# Patient Record
Sex: Female | Born: 1985
Health system: Southern US, Community
[De-identification: ages and names within clinical notes are randomized; demographics above are authoritative.]

## PROBLEM LIST (undated history)

## (undated) DIAGNOSIS — M549 Dorsalgia, unspecified: Secondary | ICD-10-CM

## (undated) DIAGNOSIS — N83209 Unspecified ovarian cyst, unspecified side: Secondary | ICD-10-CM

---

## 2007-09-14 ENCOUNTER — Inpatient Hospital Stay (HOSPITAL_COMMUNITY): Admission: AD | Admit: 2007-09-14 | Discharge: 2007-09-14 | Payer: Self-pay | Admitting: Obstetrics & Gynecology

## 2007-10-06 ENCOUNTER — Ambulatory Visit: Payer: Self-pay | Admitting: *Deleted

## 2007-11-25 ENCOUNTER — Ambulatory Visit: Payer: Self-pay | Admitting: Gynecology

## 2009-02-01 ENCOUNTER — Ambulatory Visit: Payer: Self-pay | Admitting: Obstetrics & Gynecology

## 2009-05-03 ENCOUNTER — Encounter (INDEPENDENT_AMBULATORY_CARE_PROVIDER_SITE_OTHER): Payer: Self-pay | Admitting: Obstetrics & Gynecology

## 2009-05-03 ENCOUNTER — Ambulatory Visit: Payer: Self-pay | Admitting: Obstetrics & Gynecology

## 2009-06-08 ENCOUNTER — Ambulatory Visit: Payer: Self-pay | Admitting: Obstetrics and Gynecology

## 2010-05-08 ENCOUNTER — Emergency Department (HOSPITAL_BASED_OUTPATIENT_CLINIC_OR_DEPARTMENT_OTHER): Admission: EM | Admit: 2010-05-08 | Discharge: 2010-05-08 | Payer: Self-pay | Admitting: Emergency Medicine

## 2010-08-20 ENCOUNTER — Ambulatory Visit: Payer: Self-pay | Admitting: Diagnostic Radiology

## 2010-08-20 ENCOUNTER — Emergency Department (HOSPITAL_BASED_OUTPATIENT_CLINIC_OR_DEPARTMENT_OTHER): Admission: EM | Admit: 2010-08-20 | Discharge: 2010-08-20 | Payer: Self-pay | Admitting: Emergency Medicine

## 2010-10-24 ENCOUNTER — Emergency Department (HOSPITAL_BASED_OUTPATIENT_CLINIC_OR_DEPARTMENT_OTHER)
Admission: EM | Admit: 2010-10-24 | Discharge: 2010-10-24 | Payer: Self-pay | Source: Home / Self Care | Admitting: Emergency Medicine

## 2010-10-31 ENCOUNTER — Emergency Department (HOSPITAL_BASED_OUTPATIENT_CLINIC_OR_DEPARTMENT_OTHER)
Admission: EM | Admit: 2010-10-31 | Discharge: 2010-10-31 | Payer: Self-pay | Source: Home / Self Care | Admitting: Emergency Medicine

## 2010-11-02 ENCOUNTER — Emergency Department (HOSPITAL_BASED_OUTPATIENT_CLINIC_OR_DEPARTMENT_OTHER)
Admission: EM | Admit: 2010-11-02 | Discharge: 2010-11-02 | Disposition: A | Discharge status: Other Acute Inpatient Hospital | Payer: Self-pay | Source: Home / Self Care | Admitting: Emergency Medicine

## 2010-11-02 ENCOUNTER — Inpatient Hospital Stay (HOSPITAL_COMMUNITY)
Admission: AD | Admit: 2010-11-02 | Discharge: 2010-11-02 | Payer: Self-pay | Attending: Obstetrics & Gynecology | Admitting: Obstetrics & Gynecology

## 2010-12-15 ENCOUNTER — Emergency Department (HOSPITAL_BASED_OUTPATIENT_CLINIC_OR_DEPARTMENT_OTHER)
Admission: EM | Admit: 2010-12-15 | Discharge: 2010-12-15 | Payer: Self-pay | Source: Home / Self Care | Admitting: Emergency Medicine

## 2010-12-15 LAB — URINALYSIS, ROUTINE W REFLEX MICROSCOPIC
Hgb urine dipstick: NEGATIVE
Ketones, ur: NEGATIVE mg/dL
Nitrite: NEGATIVE
Protein, ur: NEGATIVE mg/dL
Specific Gravity, Urine: 1.037 — ABNORMAL HIGH (ref 1.005–1.030)
Urine Glucose, Fasting: NEGATIVE mg/dL
Urobilinogen, UA: 0.2 mg/dL (ref 0.0–1.0)
pH: 5.5 (ref 5.0–8.0)

## 2010-12-15 LAB — WET PREP, GENITAL
Trich, Wet Prep: NONE SEEN
Yeast Wet Prep HPF POC: NONE SEEN

## 2010-12-15 LAB — PREGNANCY, URINE: Preg Test, Ur: NEGATIVE

## 2010-12-16 LAB — GC/CHLAMYDIA PROBE AMP, GENITAL
Chlamydia, DNA Probe: NEGATIVE
GC Probe Amp, Genital: NEGATIVE

## 2011-01-27 LAB — URINALYSIS, ROUTINE W REFLEX MICROSCOPIC
Bilirubin Urine: NEGATIVE
Bilirubin Urine: NEGATIVE
Glucose, UA: NEGATIVE mg/dL
Glucose, UA: NEGATIVE mg/dL
Hgb urine dipstick: NEGATIVE
Hgb urine dipstick: NEGATIVE
Ketones, ur: NEGATIVE mg/dL
Ketones, ur: NEGATIVE mg/dL
Nitrite: NEGATIVE
Nitrite: NEGATIVE
Protein, ur: NEGATIVE mg/dL
Protein, ur: NEGATIVE mg/dL
Specific Gravity, Urine: 1.013 (ref 1.005–1.030)
Specific Gravity, Urine: 1.026 (ref 1.005–1.030)
Urobilinogen, UA: 0.2 mg/dL (ref 0.0–1.0)
Urobilinogen, UA: 1 mg/dL (ref 0.0–1.0)
pH: 6.5 (ref 5.0–8.0)
pH: 7 (ref 5.0–8.0)

## 2011-01-27 LAB — CBC
HCT: 38.1 % (ref 36.0–46.0)
Hemoglobin: 13 g/dL (ref 12.0–15.0)
MCH: 29.3 pg (ref 26.0–34.0)
MCHC: 34.1 g/dL (ref 30.0–36.0)
MCV: 86 fL (ref 78.0–100.0)
Platelets: 418 10*3/uL — ABNORMAL HIGH (ref 150–400)
RBC: 4.43 MIL/uL (ref 3.87–5.11)
RDW: 11.8 % (ref 11.5–15.5)
WBC: 7.3 10*3/uL (ref 4.0–10.5)

## 2011-01-27 LAB — URINE MICROSCOPIC-ADD ON

## 2011-01-27 LAB — COMPREHENSIVE METABOLIC PANEL
ALT: 11 U/L (ref 0–35)
AST: 23 U/L (ref 0–37)
Albumin: 4.7 g/dL (ref 3.5–5.2)
Alkaline Phosphatase: 100 U/L (ref 39–117)
BUN: 14 mg/dL (ref 6–23)
CO2: 25 mEq/L (ref 19–32)
Calcium: 9.7 mg/dL (ref 8.4–10.5)
Chloride: 103 mEq/L (ref 96–112)
Creatinine, Ser: 0.9 mg/dL (ref 0.4–1.2)
GFR calc Af Amer: >60 mL/min (ref >60)
GFR calc non Af Amer: >60 mL/min (ref >60)
Glucose, Bld: 82 mg/dL (ref 70–99)
Potassium: 3.8 mEq/L (ref 3.5–5.1)
Sodium: 143 mEq/L (ref 135–145)
Total Bilirubin: 1.2 mg/dL (ref 0.3–1.2)
Total Protein: 8.9 g/dL — ABNORMAL HIGH (ref 6.0–8.3)

## 2011-01-27 LAB — DIFFERENTIAL
Basophils Absolute: 0 10*3/uL (ref 0.0–0.1)
Basophils Relative: 0 % (ref 0–1)
Eosinophils Absolute: 0 10*3/uL (ref 0.0–0.7)
Eosinophils Relative: 0 % (ref 0–5)
Lymphocytes Relative: 33 % (ref 12–46)
Lymphs Abs: 2.4 10*3/uL (ref 0.7–4.0)
Monocytes Absolute: 0.7 10*3/uL (ref 0.1–1.0)
Monocytes Relative: 9 % (ref 3–12)
Neutro Abs: 4.2 10*3/uL (ref 1.7–7.7)
Neutrophils Relative %: 58 % (ref 43–77)

## 2011-01-27 LAB — WET PREP, GENITAL
Clue Cells Wet Prep HPF POC: NONE SEEN
Trich, Wet Prep: NONE SEEN
Trich, Wet Prep: NONE SEEN
Yeast Wet Prep HPF POC: NONE SEEN

## 2011-01-27 LAB — GC/CHLAMYDIA PROBE AMP, GENITAL
Chlamydia, DNA Probe: POSITIVE — AB
Chlamydia, DNA Probe: POSITIVE — AB
GC Probe Amp, Genital: NEGATIVE
GC Probe Amp, Genital: NEGATIVE

## 2011-01-27 LAB — PREGNANCY, URINE
Preg Test, Ur: NEGATIVE
Preg Test, Ur: NEGATIVE

## 2011-01-30 LAB — BASIC METABOLIC PANEL
BUN: 10 mg/dL (ref 6–23)
CO2: 26 mEq/L (ref 19–32)
Calcium: 9.6 mg/dL (ref 8.4–10.5)
Chloride: 105 mEq/L (ref 96–112)
Creatinine, Ser: 0.7 mg/dL (ref 0.4–1.2)
GFR calc Af Amer: >60 mL/min (ref >60)
GFR calc non Af Amer: >60 mL/min (ref >60)
Glucose, Bld: 83 mg/dL (ref 70–99)
Potassium: 4.2 mEq/L (ref 3.5–5.1)
Sodium: 140 mEq/L (ref 135–145)

## 2011-01-30 LAB — D-DIMER, QUANTITATIVE: D-Dimer, Quant: <0.22 ug/mL-FEU (ref 0.00–0.48)

## 2011-02-02 LAB — GC/CHLAMYDIA PROBE AMP, GENITAL
Chlamydia, DNA Probe: POSITIVE — AB
GC Probe Amp, Genital: NEGATIVE

## 2011-02-02 LAB — URINALYSIS, ROUTINE W REFLEX MICROSCOPIC
Glucose, UA: NEGATIVE mg/dL
Hgb urine dipstick: NEGATIVE
Ketones, ur: NEGATIVE mg/dL
Protein, ur: NEGATIVE mg/dL
Urobilinogen, UA: 0.2 mg/dL (ref 0.0–1.0)

## 2011-02-02 LAB — WET PREP, GENITAL
Trich, Wet Prep: NONE SEEN
Yeast Wet Prep HPF POC: NONE SEEN

## 2011-02-24 LAB — POCT PREGNANCY, URINE: Preg Test, Ur: NEGATIVE

## 2011-04-01 NOTE — Group Therapy Note (Signed)
NAME:  Anita Gomez, Anita Gomez NO.:  [id number]   MEDICAL RECORD NO.:  [id number]          PATIENT TYPE:  WOC   LOCATION:  [facility]                   FACILITY:  [facility]   PHYSICIAN:  Scheryl Darter, MD       DATE OF BIRTH:  10-18-1986   DATE OF SERVICE:                                  CLINIC NOTE   Nocturnal decaying gynecology clinic.  The patient has [employer]  [phone].  The patient's neck is a 25 year old black female gravida 0,  history of irregular periods with prior diagnosis of polycystic ovary  syndrome.  Last period was light about January 20, 2009.  It had been  several months since the previous menses.  She has stopped her oral  contraceptive pills that were prescribed at her last visit.  Today she  comes in saying she has had about 3 months of vaginal discharge.  She  was seen in the emergency room in January at [facility] and  they diagnosed her with urinary tract infection.  She says that no  pelvic exam was done.  She denies any pain or abnormal bleeding, itching  or burning.  There is slight odor.   PAST MEDICAL HISTORY:  Eczema.   PAST SURGICAL HISTORY:  None.   SOCIAL HISTORY:  The patient is single, and she denies alcohol, tobacco  or drug use.   ALLERGIES:  To SHELLFISH. No latex allergy.   PHYSICAL EXAMINATION:  GENERAL:  She appears in no acute distress.  VITAL SIGNS:  Weight 162 pounds, BP 133/86.  ABDOMEN:  Nondistended, nontender.  EXTERNAL GENITALIA:  Vagina and cervix show slight discharge and odor.  Cervix is normal.  Uterus normal size, nontender, no mass. Wet mount  showed many clue cells, few white blood cells, no yeast, no Trichomonas.   IMPRESSION:  1. Bacterial vaginosis.  2. Irregular periods with polycystic ovary syndrome.   PLAN:  I gave a prescription for MetroGel vaginal h.s. for 5 Thursdays.  She wants to start back on her oral contraceptives.  I gave a her a new  prescription for Mircette.  She says she has  had no sex since her last  period.  I recommended that she use condoms to prevent sexually  transmitted diseases.  She will return in about 3 months for a yearly  exam.      Scheryl Darter, MD    JA/MEDQ  D:  02/01/2009  T:  02/01/2009  Job:  [id number]

## 2011-04-01 NOTE — Group Therapy Note (Signed)
Anita Gomez, Anita Gomez NO.:  [id number]   MEDICAL RECORD NO.:  [id number]          PATIENT TYPE:  WOC   LOCATION:  [facility]                   FACILITY:  [facility]   PHYSICIAN:  Karlton Lemon, MD      DATE OF BIRTH:  06/18/1986   DATE OF SERVICE:                                  CLINIC NOTE   CHIEF COMPLAINT:  No periods for past 7-8 months.   HISTORY OF PRESENT ILLNESS:  This is a 25 year old gravida 0, presenting  with 7-8 months of secondary amenorrhea.  The patient was seen on  09/14/07 in the [facility] for this complaint.  She was evaluated there but  states she did not have a urine pregnancy test at that time.  The  patient states that her menarche was at age 5.  Since that time she has  had periods every 2-3 months lasting 10-14 days requiring 2-3 pads a  day.  She states that she has never had regular periods.  She denies any  history of sexual intercourse at this time.  She denies any pelvic pain,  vaginal disorder or other complaints.  She is otherwise healthy.   PAST MEDICAL HISTORY:  Eczema.   PAST SURGICAL HISTORY:  None.   MEDICATIONS:  Steroid cream unspecified type twice daily for eczema.   ALLERGIES:  No known drug allergies. She is allergic to shell fish.   PHYSICAL EXAMINATION:  GENERAL:  This is a well-appearing young female  in no distress.  VITALS:  Temperature 97.8, pulse 59, blood pressure 123/83.  HEENT:  Head is normocephalic, atraumatic.  Sclerae are nonicteric.  Oropharynx is clear without erythema or exudate.  CARDIOVASCULAR:  Heart is regular rate and rhythm.  No murmurs, rubs or  gallops noted.  RESPIRATORY:  Lungs are clear to auscultation bilaterally.  ABDOMEN:  Soft, nontender to palpation.  Positive bowel sounds are noted  in all four quadrants.  There are no masses palpable.  GU:  Normal female external genitalia.  Pubic hairs are shaved in  general region.  Vaginal mucosa is pink and moist.  There is no  discharge noted.   Cervix appears nulliparous.  On bimanual exam, uterus  is midline without cervical motion tenderness.  Adnexa difficult to  palpate due to the patient tensing her abdominal muscles on bimanual  exam.  EXTREMITIES:  No edema or tenderness.   ASSESSMENT AND PLAN:  This is a 25 year old gravida 0, with secondary  amenorrhea and history of irregular menstrual periods.  We will evaluate  for possible hypothalamic pituitary ovarian disorder possible polycystic  ovarian syndrome or pregnancy.  1. We will evaluate with prolactin dihydroepiandrosterone serum, beta      hCG, thyroid simulating hormone, and follicle stimulate hormone      levels.  2. The patient is to follow in 2 weeks to discuss these results.  At      that time we will possibly initiate management based on results of      these tests.           ______________________________  Karlton Lemon, MD     NS/MEDQ  D:  10/06/2007  T:  10/07/2007  Job:  [id number]

## 2011-04-01 NOTE — Group Therapy Note (Signed)
NAMETULIP, MEHARG NO.:  [id number]   MEDICAL RECORD NO.:  [id number]          PATIENT TYPE:  WOC   LOCATION:  [facility]                   FACILITY:  [facility]   PHYSICIAN:  Wynelle Bourgeois, CNM    DATE OF BIRTH:  09/28/1986   DATE OF SERVICE:                                  CLINIC NOTE   DICTATED BY:  Marissa Nestle, Physician's Assistant, scribe for Wynelle Bourgeois, C.N.M.   CHIEF COMPLAINT:  Vaginal discharge and slight odor.   HISTORY OF PRESENT ILLNESS:  Ms. Kithcart is a 25 year old African  American female, gravida 0, who returns for a follow-up visit for  bacterial vaginosis that she had back on Apr 03, 2009, for which she was  prescribed MetroGel for five weeks.  She reports she finished the  prescription, but has most recently, over the past couple of weeks,  noticed an increased white milky discharge from her vagina, along with a  slight odor.  She denies any pelvic pain, any vaginal pain or bleeding.  She also reports that she continues to have irregular menstrual periods,  as she does not menstruate every month.  She in fact reports that she  has never had normal menstrual cycles since beginning her period at the  age of 57.  She has reportedly not had a period in the past six months.  In addition, the patient reports that she has not had an annual Pap  smear.  She does not recall when the last one she had done.  She wishes  to have her Pap smear completed today as well.   PAST MEDICAL HISTORY:  Eczema.   PAST SURGICAL HISTORY:  None.   SOCIAL HISTORY:  The patient is single and denies any alcohol, tobacco  or drug use.   ALLERGIES:  SHELL FISH.  No latex allergy.  No known drug allergies  reported.   REVIEW OF SYSTEMS:  GENERAL:  Denies any weight change, fatigue,  weakness, fevers or chills.  SKIN:  No rashes, lumps or moles.  LUNGS:  No shortness of breath noted.  CARDIAC:  No chest pain.  GI:  No nausea,  vomiting or diarrhea.  No  constipation or abdominal pain.  URINARY:  Denies any frequency or urgency or dysuria.  GENITAL:  Refer to the HPI.   PHYSICAL EXAMINATION:  GENERAL:  A 25 year old African American well-  appearing female, of normal stature, normal appropriate development with  good hygiene, in no apparent distress.  VITAL SIGNS:  Temperature 97.5 degrees, pulse 60, blood pressure 122/78,  weight 162.6 pounds, height 64-1/5 inches.  LUNGS:  Clear to auscultation bilaterally.  No wheezes, crackles or  rhonchi.  CARDIAC:  Regular rate and rhythm.  Good S1 and S2.  No lifts, heaves or  thrills.  ABDOMEN:  Soft, nondistended.  Positive bowel sounds.  Nontender to deep  and light palpation.  GENITOURINARY/BIMANUAL EXAM:  Vagina and cervix show no abnormal  discharge or odor.  Cervix is normal-appearing and not inflamed, with no  sign of bleeding.  No cervical motion tenderness appreciated on bimanual  exam.  No adnexal masses noted.  A  Pap smear completed along with GC and  Chlamydia.   ASSESSMENT:  1. Resolved bacterial vaginosis.  2. Oligomenorrhea.   PLAN:  The patient was given a prescription for Ortho Evra patch and is  instructed to follow up in four to six weeks, for a nurse visit to  monitor her blood pressure.  In addition, the patient will follow up for  her Pap smear results at that time.           ______________________________  Wynelle Bourgeois, CNM     MW/MEDQ  D:  05/03/2009  T:  05/03/2009  Job:  [id number]

## 2011-08-10 ENCOUNTER — Emergency Department (HOSPITAL_BASED_OUTPATIENT_CLINIC_OR_DEPARTMENT_OTHER)
Admission: EM | Admit: 2011-08-10 | Discharge: 2011-08-10 | Disposition: A | Discharge status: Home / Self Care | Payer: BC Managed Care – PPO | Attending: Emergency Medicine | Admitting: Emergency Medicine

## 2011-08-10 ENCOUNTER — Encounter: Payer: Self-pay | Admitting: *Deleted

## 2011-08-10 DIAGNOSIS — R300 Dysuria: Secondary | ICD-10-CM | POA: Insufficient documentation

## 2011-08-10 DIAGNOSIS — R109 Unspecified abdominal pain: Secondary | ICD-10-CM | POA: Insufficient documentation

## 2011-08-10 DIAGNOSIS — N390 Urinary tract infection, site not specified: Secondary | ICD-10-CM

## 2011-08-10 LAB — URINE MICROSCOPIC-ADD ON

## 2011-08-10 LAB — URINALYSIS, ROUTINE W REFLEX MICROSCOPIC
Bilirubin Urine: NEGATIVE
Hgb urine dipstick: NEGATIVE
Ketones, ur: NEGATIVE mg/dL
Nitrite: NEGATIVE
Specific Gravity, Urine: 1.031 — ABNORMAL HIGH (ref 1.005–1.030)
pH: 6 (ref 5.0–8.0)

## 2011-08-10 MED ORDER — SULFAMETHOXAZOLE-TRIMETHOPRIM 800-160 MG PO TABS: 1.0000 | ORAL_TABLET | Freq: Two times a day (BID) | ORAL | Status: AC

## 2011-08-10 NOTE — ED Provider Notes (Signed)
History     CSN: [id number] Arrival date & time: 08/10/2011 12:36 PM  No chief complaint on file.   HPI  (Consider location/radiation/quality/duration/timing/severity/associated sxs/prior treatment)  Patient is a 25 y.o. female presenting with dysuria. The history is provided by the patient. No language interpreter was used.  Dysuria  This is a new problem. The current episode started 2 days ago. The problem occurs intermittently. The problem has not changed since onset.She is sexually active. Associated symptoms include nausea, frequency, hesitancy and urgency. Pertinent negatives include no vomiting, no discharge and no hematuria. She has tried nothing for the symptoms.    No past medical history on file.  No past surgical history on file.  No family history on file.  History  Substance Use Topics  . Smoking status: Not on file  . Smokeless tobacco: Not on file  . Alcohol Use: Not on file    OB History    No data available      Review of Systems  Review of Systems  Gastrointestinal: Positive for nausea. Negative for vomiting.  Genitourinary: Positive for dysuria, hesitancy, urgency and frequency. Negative for hematuria.  All other systems reviewed and are negative.    Allergies  Review of patient's allergies indicates not on file.  Home Medications  No current outpatient prescriptions on file.  Physical Exam    BP 140/79  Pulse 77  Temp(Src) 97.5 F (36.4 C) (Oral)  Resp 16  Ht 5\' 4"  (1.626 m)  Wt 170 lb (77.111 kg)  BMI 29.18 kg/m2  SpO2 100%  Physical Exam  Nursing note and vitals reviewed. Constitutional: She is oriented to person, place, and time. She appears well-developed and well-nourished.  HENT:  Head: Normocephalic.  Cardiovascular: Normal rate and regular rhythm.   Pulmonary/Chest: Effort normal and breath sounds normal.  Abdominal: Soft. Bowel sounds are normal.  Musculoskeletal: Normal range of motion.  Neurological: She is alert and  oriented to person, place, and time.  Skin: Skin is warm and dry.  Psychiatric: She has a normal mood and affect.    ED Course  Procedures (including critical care time)  Results for orders placed during the hospital encounter of 08/10/11  URINALYSIS, ROUTINE W REFLEX MICROSCOPIC      Component Value Range   Color, Urine YELLOW  YELLOW    Appearance CLOUDY (*) CLEAR    Specific Gravity, Urine 1.031 (*) 1.005 - 1.030    pH 6.0  5.0 - 8.0    Glucose, UA NEGATIVE  NEGATIVE (mg/dL)   Hgb urine dipstick NEGATIVE  NEGATIVE    Bilirubin Urine NEGATIVE  NEGATIVE    Ketones, ur NEGATIVE  NEGATIVE (mg/dL)   Protein, ur NEGATIVE  NEGATIVE (mg/dL)   Urobilinogen, UA 1.0  0.0 - 1.0 (mg/dL)   Nitrite NEGATIVE  NEGATIVE    Leukocytes, UA MODERATE (*) NEGATIVE   PREGNANCY, URINE      Component Value Range   Preg Test, Ur NEGATIVE    URINE MICROSCOPIC-ADD ON      Component Value Range   Squamous Epithelial / LPF FEW (*) RARE    WBC, UA 21-50  <3 (WBC/hpf)   Bacteria, UA MANY (*) RARE    Urine-Other MUCOUS PRESENT     No results found.    MDM Will treat pt for simple uti:pt is afebrile without any back or abdominal pain        Teressa Lower, NP 08/10/11 1311

## 2011-08-10 NOTE — ED Notes (Signed)
C/o frequency, urgency and lower abd pressure starting last Thursday, sexually active with 1 partner, condoms, denies pregnancy. LMP 8/21 normal , report urine has an odor, she is drinking water and cranberry juice, denies vag drainage

## 2011-08-10 NOTE — ED Provider Notes (Signed)
Medical screening examination/treatment/procedure(s) were performed by non-physician practitioner and as supervising physician I was immediately available for consultation/collaboration.      Rolan Bucco, MD 08/10/11 [phone]

## 2012-08-13 ENCOUNTER — Encounter (HOSPITAL_BASED_OUTPATIENT_CLINIC_OR_DEPARTMENT_OTHER): Payer: Self-pay | Admitting: *Deleted

## 2012-08-13 ENCOUNTER — Emergency Department (HOSPITAL_BASED_OUTPATIENT_CLINIC_OR_DEPARTMENT_OTHER)
Admission: EM | Admit: 2012-08-13 | Discharge: 2012-08-13 | Disposition: A | Discharge status: Home / Self Care | Payer: Self-pay | Attending: Emergency Medicine | Admitting: Emergency Medicine

## 2012-08-13 DIAGNOSIS — B9689 Other specified bacterial agents as the cause of diseases classified elsewhere: Secondary | ICD-10-CM | POA: Insufficient documentation

## 2012-08-13 DIAGNOSIS — N760 Acute vaginitis: Secondary | ICD-10-CM | POA: Insufficient documentation

## 2012-08-13 DIAGNOSIS — B373 Candidiasis of vulva and vagina: Secondary | ICD-10-CM

## 2012-08-13 DIAGNOSIS — B3731 Acute candidiasis of vulva and vagina: Secondary | ICD-10-CM | POA: Insufficient documentation

## 2012-08-13 DIAGNOSIS — A499 Bacterial infection, unspecified: Secondary | ICD-10-CM | POA: Insufficient documentation

## 2012-08-13 LAB — WET PREP, GENITAL

## 2012-08-13 LAB — URINALYSIS, ROUTINE W REFLEX MICROSCOPIC
Ketones, ur: NEGATIVE mg/dL
Nitrite: NEGATIVE
Protein, ur: NEGATIVE mg/dL
Urobilinogen, UA: 0.2 mg/dL (ref 0.0–1.0)

## 2012-08-13 LAB — PREGNANCY, URINE: Preg Test, Ur: NEGATIVE

## 2012-08-13 MED ORDER — METRONIDAZOLE 500 MG PO TABS: 500.0000 mg | ORAL_TABLET | Freq: Two times a day (BID) | ORAL | Status: DC

## 2012-08-13 MED ORDER — FLUCONAZOLE 50 MG PO TABS
150.0000 mg | ORAL_TABLET | Freq: Once | ORAL | Status: AC
  Administered 2012-08-13: 150 mg via ORAL
  Filled 2012-08-13: qty 1

## 2012-08-13 NOTE — ED Provider Notes (Signed)
Medical screening examination/treatment/procedure(s) were performed by non-physician practitioner and as supervising physician I was immediately available for consultation/collaboration.   Charles B. Bernette Mayers, MD 08/13/12 [phone]

## 2012-08-13 NOTE — ED Notes (Signed)
Reports last eating at noon today.

## 2012-08-13 NOTE — ED Notes (Signed)
Vaginal discharge x 3 weeks. 

## 2012-08-13 NOTE — ED Provider Notes (Signed)
History     CSN: [id number]  Arrival date & time 08/13/12  1900   First MD Initiated Contact with Patient 08/13/12 1944      Chief Complaint  Patient presents with  . Vaginal Discharge    (Consider location/radiation/quality/duration/timing/severity/associated sxs/prior treatment) HPI Comments: Patient presents with vaginal discharge for approximately 3 weeks. The discharge is white, lumpy, and without odor. She has tried treating it with 'BH cream' however it has not helped. Denies dysuria, frequency, nausea, vomiting. Patient has had diarrhea for 1.5 weeks but it is improving. She is sexually active with one female and uses condoms. She is not concerned for any STI's. She has irregular menses with her last period in early August. Onset gradual. Course is constant. Nothing makes symptoms better or worse.  The history is provided by the patient.    History reviewed. No pertinent past medical history.  History reviewed. No pertinent past surgical history.  No family history on file.  History  Substance Use Topics  . Smoking status: Never Smoker   . Smokeless tobacco: Not on file  . Alcohol Use: No    OB History    Grav Para Term Preterm Abortions TAB SAB Ect Mult Living                  Review of Systems  Constitutional: Negative for fever.  HENT: Negative for sore throat and rhinorrhea.   Gastrointestinal: Negative for nausea, vomiting, abdominal pain and diarrhea.  Genitourinary: Positive for vaginal discharge. Negative for dysuria, frequency, vaginal pain and pelvic pain.  Musculoskeletal: Negative for myalgias.  Skin: Negative for rash.    Allergies  Review of patient's allergies indicates no known allergies.  Home Medications  No current outpatient prescriptions on file.  BP 131/77  Pulse 66  Temp 98.1 F (36.7 C) (Oral)  Resp 18  SpO2 100%  LMP 06/17/2012  Physical Exam  Nursing note and vitals reviewed. Constitutional: She appears well-developed and  well-nourished.  HENT:  Head: Normocephalic and atraumatic.  Eyes: Conjunctivae normal are normal.  Neck: Normal range of motion. Neck supple.  Cardiovascular: Normal rate and regular rhythm.   No murmur heard. Pulmonary/Chest: Effort normal and breath sounds normal. No respiratory distress. She has no wheezes.  Abdominal: Soft. Bowel sounds are normal. There is no tenderness.  Genitourinary: Cervix exhibits no motion tenderness and no discharge. Right adnexum displays no mass and no tenderness. Left adnexum displays no mass and no tenderness. No erythema, tenderness or bleeding around the vagina. Vaginal discharge (white, thick) found.  Neurological: She is alert.  Skin: Skin is warm and dry.  Psychiatric: She has a normal mood and affect.    ED Course  Procedures (including critical care time)  Labs Reviewed  URINALYSIS, ROUTINE W REFLEX MICROSCOPIC - Abnormal; Notable for the following:    APPearance CLOUDY (*)     Leukocytes, UA SMALL (*)     All other components within normal limits  URINE MICROSCOPIC-ADD ON - Abnormal; Notable for the following:    Squamous Epithelial / LPF FEW (*)     Bacteria, UA MANY (*)     All other components within normal limits  WET PREP, GENITAL - Abnormal; Notable for the following:    Yeast Wet Prep HPF POC MODERATE (*)     Clue Cells Wet Prep HPF POC MODERATE (*)     WBC, Wet Prep HPF POC FEW (*)     All other components within normal limits  PREGNANCY, URINE  GC/CHLAMYDIA PROBE AMP, GENITAL   No results found.   1. Vaginal candidiasis   2. Bacterial vaginosis     8:17 PM Patient seen and examined. Work-up initiated.   Vital signs reviewed and are as follows: Filed Vitals:   08/13/12 1914  BP: 131/77  Pulse: 66  Temp: 98.1 F (36.7 C)  Resp: 18   Pelvic performed with chaperone.  Patient informed of results. Will treat in emergency department with Diflucan. Will treat BV with prescription of Flagyl.  Patient urged to follow  up with her gynecologist. Renae Gloss to return with worsening symptoms or other concerns. Patient verbalizes understanding and agrees with plan.   MDM  Vaginal discharge: Moderate yeast, moderate clue cells. Will treat for both bacterial vaginosis and vaginal candidiasis. No tenderness on pelvic exam. Do not suspect PID. Patient appears well and nontoxic.       Renne Crigler, [place] 08/13/12 2220

## 2012-08-14 LAB — GC/CHLAMYDIA PROBE AMP, GENITAL
Chlamydia, DNA Probe: NEGATIVE
GC Probe Amp, Genital: NEGATIVE

## 2013-10-21 ENCOUNTER — Emergency Department (HOSPITAL_BASED_OUTPATIENT_CLINIC_OR_DEPARTMENT_OTHER)
Admission: EM | Admit: 2013-10-21 | Discharge: 2013-10-21 | Disposition: A | Discharge status: Home / Self Care | Payer: BC Managed Care – PPO | Attending: Emergency Medicine | Admitting: Emergency Medicine

## 2013-10-21 ENCOUNTER — Encounter (HOSPITAL_BASED_OUTPATIENT_CLINIC_OR_DEPARTMENT_OTHER): Payer: Self-pay | Admitting: Emergency Medicine

## 2013-10-21 DIAGNOSIS — Z792 Long term (current) use of antibiotics: Secondary | ICD-10-CM | POA: Insufficient documentation

## 2013-10-21 DIAGNOSIS — J029 Acute pharyngitis, unspecified: Secondary | ICD-10-CM | POA: Insufficient documentation

## 2013-10-21 LAB — RAPID STREP SCREEN (MED CTR MEBANE ONLY): Streptococcus, Group A Screen (Direct): NEGATIVE

## 2013-10-21 MED ORDER — DEXAMETHASONE 4 MG PO TABS
8.0000 mg | ORAL_TABLET | Freq: Once | ORAL | Status: AC
  Administered 2013-10-21: 8 mg via ORAL
  Filled 2013-10-21: qty 2

## 2013-10-21 NOTE — ED Provider Notes (Signed)
CSN: [id number]     Arrival date & time 10/21/13  1234 History   First MD Initiated Contact with Patient 10/21/13 1239     Chief Complaint  Patient presents with  . Sore Throat   (Consider location/radiation/quality/duration/timing/severity/associated sxs/prior Treatment) Patient is a 27 y.o. female presenting with pharyngitis. The history is provided by the patient. No language interpreter was used.  Sore Throat This is a new problem. The current episode started yesterday. The problem occurs constantly. The problem has been unchanged. Associated symptoms include coughing. Pertinent negatives include no fever. Associated symptoms comments: Post nasal drip. The symptoms are aggravated by swallowing. She has tried nothing for the symptoms.    History reviewed. No pertinent past medical history. History reviewed. No pertinent past surgical history. No family history on file. History  Substance Use Topics  . Smoking status: Never Smoker   . Smokeless tobacco: Not on file  . Alcohol Use: No   OB History   Grav Para Term Preterm Abortions TAB SAB Ect Mult Living                 Review of Systems  Constitutional: Negative for fever.  Respiratory: Positive for cough.     Allergies  Review of patient's allergies indicates no known allergies.  Home Medications   Current Outpatient Rx  Name  Route  Sig  Dispense  Refill  . metroNIDAZOLE (FLAGYL) 500 MG tablet   Oral   Take 1 tablet (500 mg total) by mouth 2 (two) times daily.   14 tablet   0    BP 147/53  Pulse 64  Temp(Src) 98.6 F (37 C) (Oral)  Resp 20  Wt 170 lb (77.111 kg)  SpO2 100% Physical Exam  Nursing note and vitals reviewed. Constitutional: She appears well-developed and well-nourished.  HENT:  Right Ear: External ear normal.  Left Ear: External ear normal.  Nose: Rhinorrhea present.  Mouth/Throat: Posterior oropharyngeal erythema present.  Eyes: EOM are normal. Pupils are equal, round, and reactive to  light.  Cardiovascular: Normal rate and regular rhythm.   Pulmonary/Chest: Effort normal and breath sounds normal.    ED Course  Procedures (including critical care time) Labs Review Labs Reviewed  RAPID STREP SCREEN   Imaging Review No results found.  EKG Interpretation   None       MDM   1. Pharyngitis    Likely viral:no antibiotics needed at this time    Teressa Lower, NP 10/21/13 2100

## 2013-10-21 NOTE — ED Notes (Signed)
Sore throat since yesterday.

## 2013-10-23 NOTE — ED Provider Notes (Signed)
Medical screening examination/treatment/procedure(s) were performed by non-physician practitioner and as supervising physician I was immediately available for consultation/collaboration.  EKG Interpretation   None         Ardean Melroy David Brady Plant, MD 10/23/13 1114 

## 2014-03-28 ENCOUNTER — Emergency Department (HOSPITAL_BASED_OUTPATIENT_CLINIC_OR_DEPARTMENT_OTHER)
Admission: EM | Admit: 2014-03-28 | Discharge: 2014-03-28 | Disposition: A | Discharge status: Home / Self Care | Payer: No Typology Code available for payment source | Attending: Emergency Medicine | Admitting: Emergency Medicine

## 2014-03-28 ENCOUNTER — Encounter (HOSPITAL_BASED_OUTPATIENT_CLINIC_OR_DEPARTMENT_OTHER): Payer: Self-pay | Admitting: Emergency Medicine

## 2014-03-28 DIAGNOSIS — S39012A Strain of muscle, fascia and tendon of lower back, initial encounter: Secondary | ICD-10-CM

## 2014-03-28 DIAGNOSIS — F17200 Nicotine dependence, unspecified, uncomplicated: Secondary | ICD-10-CM | POA: Insufficient documentation

## 2014-03-28 DIAGNOSIS — Z79899 Other long term (current) drug therapy: Secondary | ICD-10-CM | POA: Insufficient documentation

## 2014-03-28 DIAGNOSIS — Y9389 Activity, other specified: Secondary | ICD-10-CM | POA: Insufficient documentation

## 2014-03-28 DIAGNOSIS — Y9289 Other specified places as the place of occurrence of the external cause: Secondary | ICD-10-CM | POA: Insufficient documentation

## 2014-03-28 DIAGNOSIS — S335XXA Sprain of ligaments of lumbar spine, initial encounter: Secondary | ICD-10-CM | POA: Insufficient documentation

## 2014-03-28 MED ORDER — METHOCARBAMOL 500 MG PO TABS
ORAL_TABLET | ORAL | Status: AC
  Filled 2014-03-28: qty 1

## 2014-03-28 MED ORDER — IBUPROFEN 400 MG PO TABS
ORAL_TABLET | ORAL | Status: AC
  Filled 2014-03-28: qty 1

## 2014-03-28 MED ORDER — METHOCARBAMOL 500 MG PO TABS: 500.0000 mg | ORAL_TABLET | Freq: Three times a day (TID) | ORAL | Status: DC | PRN

## 2014-03-28 MED ORDER — IBUPROFEN 200 MG PO TABS
ORAL_TABLET | ORAL | Status: AC
  Filled 2014-03-28: qty 1

## 2014-03-28 MED ORDER — IBUPROFEN 600 MG PO TABS: 600.0000 mg | ORAL_TABLET | Freq: Four times a day (QID) | ORAL | Status: DC | PRN

## 2014-03-28 MED ORDER — METHOCARBAMOL 500 MG PO TABS
500.0000 mg | ORAL_TABLET | Freq: Once | ORAL | Status: AC
  Administered 2014-03-28: 500 mg via ORAL

## 2014-03-28 MED ORDER — IBUPROFEN 400 MG PO TABS
600.0000 mg | ORAL_TABLET | Freq: Once | ORAL | Status: AC
  Administered 2014-03-28: 600 mg via ORAL
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
## 2014-03-28 NOTE — Discharge Instructions (Signed)

## 2014-07-10 ENCOUNTER — Emergency Department (HOSPITAL_BASED_OUTPATIENT_CLINIC_OR_DEPARTMENT_OTHER)
Admission: EM | Admit: 2014-07-10 | Discharge: 2014-07-10 | Disposition: A | Discharge status: Home / Self Care | Payer: BC Managed Care – PPO | Attending: Emergency Medicine | Admitting: Emergency Medicine

## 2014-07-10 ENCOUNTER — Encounter (HOSPITAL_BASED_OUTPATIENT_CLINIC_OR_DEPARTMENT_OTHER): Payer: Self-pay | Admitting: Emergency Medicine

## 2014-07-10 DIAGNOSIS — N898 Other specified noninflammatory disorders of vagina: Secondary | ICD-10-CM | POA: Insufficient documentation

## 2014-07-10 DIAGNOSIS — Z3202 Encounter for pregnancy test, result negative: Secondary | ICD-10-CM | POA: Insufficient documentation

## 2014-07-10 DIAGNOSIS — A5901 Trichomonal vulvovaginitis: Secondary | ICD-10-CM | POA: Insufficient documentation

## 2014-07-10 DIAGNOSIS — Z792 Long term (current) use of antibiotics: Secondary | ICD-10-CM | POA: Insufficient documentation

## 2014-07-10 DIAGNOSIS — A499 Bacterial infection, unspecified: Secondary | ICD-10-CM | POA: Insufficient documentation

## 2014-07-10 DIAGNOSIS — N760 Acute vaginitis: Secondary | ICD-10-CM | POA: Insufficient documentation

## 2014-07-10 DIAGNOSIS — F17200 Nicotine dependence, unspecified, uncomplicated: Secondary | ICD-10-CM | POA: Insufficient documentation

## 2014-07-10 DIAGNOSIS — B9689 Other specified bacterial agents as the cause of diseases classified elsewhere: Secondary | ICD-10-CM | POA: Insufficient documentation

## 2014-07-10 HISTORY — DX: Dorsalgia, unspecified: M54.9

## 2014-07-10 LAB — URINALYSIS, ROUTINE W REFLEX MICROSCOPIC
Bilirubin Urine: NEGATIVE
GLUCOSE, UA: NEGATIVE mg/dL
Hgb urine dipstick: NEGATIVE
Ketones, ur: NEGATIVE mg/dL
NITRITE: NEGATIVE
PH: 6 (ref 5.0–8.0)
PROTEIN: NEGATIVE mg/dL
Specific Gravity, Urine: 1.022 (ref 1.005–1.030)
Urobilinogen, UA: 0.2 mg/dL (ref 0.0–1.0)

## 2014-07-10 LAB — PREGNANCY, URINE: Preg Test, Ur: NEGATIVE

## 2014-07-10 LAB — WET PREP, GENITAL: YEAST WET PREP: NONE SEEN

## 2014-07-10 LAB — URINE MICROSCOPIC-ADD ON

## 2014-07-10 MED ORDER — AZITHROMYCIN 250 MG PO TABS
1000.0000 mg | ORAL_TABLET | Freq: Once | ORAL | Status: AC
  Administered 2014-07-10: 1000 mg via ORAL
  Filled 2014-07-10: qty 4

## 2014-07-10 MED ORDER — LIDOCAINE HCL (PF) 1 % IJ SOLN
INTRAMUSCULAR | Status: AC
  Administered 2014-07-10: 1.2 mL
  Filled 2014-07-10: qty 5

## 2014-07-10 MED ORDER — CEFTRIAXONE SODIUM 250 MG IJ SOLR
250.0000 mg | Freq: Once | INTRAMUSCULAR | Status: AC
  Administered 2014-07-10: 250 mg via INTRAMUSCULAR
  Filled 2014-07-10: qty 250

## 2014-07-10 MED ORDER — METRONIDAZOLE 500 MG PO TABS
2000.0000 mg | ORAL_TABLET | Freq: Once | ORAL | Status: AC
  Administered 2014-07-10: 2000 mg via ORAL
  Filled 2014-07-10: qty 4

## 2014-07-10 NOTE — ED Notes (Signed)
MD at bedside. 

## 2014-07-10 NOTE — Discharge Instructions (Signed)

## 2014-07-10 NOTE — ED Notes (Signed)
White vaginal discharge with some itching and discomfort x "few weeks".

## 2014-07-10 NOTE — ED Provider Notes (Signed)
CSN: [id number]     Arrival date & time 07/10/14  1827 History  This chart was scribed for Hilario Quarry, MD by Charline Bills, ED Scribe. The patient was seen in room MH07/MH07. Patient's care was started at 6:38 PM.   Chief Complaint  Patient presents with  . Vaginal Discharge   Patient is a 28 y.o. female presenting with vaginal discharge. The history is provided by the patient. No language interpreter was used.  Vaginal Discharge Quality:  White Severity:  Mild Timing:  Constant Chronicity:  New Associated symptoms: abdominal pain and vaginal itching   Associated symptoms: no dyspareunia and no urinary frequency   Risk factors: no STI exposure    HPI Comments: Anita Gomez is a 28 y.o. female who presents to the Emergency Department complaining of white vaginal discharge onset a few weeks ago. Pt reports associated lower abdominal pain, dark colored urine and vaginal itching. She denies dyspareunia, urgency, frequency. Pt denies h/o similar pain. She denies h/o similar symptoms. Pt reports h/o ovarian cyst. Pt denies possibility of pregnancy. No h/o pregnancy. She also denies exposure to STD. Pt reports 1 female partner within the past year; 5 total. No BC.  LNMP 07/07/14.  No past medical history on file. No past surgical history on file. No family history on file. History  Substance Use Topics  . Smoking status: Current Every Day Smoker -- 0.50 packs/day  . Smokeless tobacco: Not on file  . Alcohol Use: No   OB History   Grav Para Term Preterm Abortions TAB SAB Ect Mult Living                 Review of Systems  Gastrointestinal: Positive for abdominal pain.  Genitourinary: Positive for vaginal discharge. Negative for urgency, frequency and dyspareunia.       Vaginal itching  All other systems reviewed and are negative.  Allergies  Review of patient's allergies indicates no known allergies.  Home Medications   Prior to Admission medications   Medication Sig Start  Date End Date Taking? Authorizing Provider  ibuprofen (ADVIL,MOTRIN) 600 MG tablet Take 1 tablet (600 mg total) by mouth every 6 (six) hours as needed. 03/28/14   Loren Racer, MD  methocarbamol (ROBAXIN) 500 MG tablet Take 1 tablet (500 mg total) by mouth every 8 (eight) hours as needed for muscle spasms. 03/28/14   Loren Racer, MD  metroNIDAZOLE (FLAGYL) 500 MG tablet Take 1 tablet (500 mg total) by mouth 2 (two) times daily. 08/13/12   Renne Crigler, PA-C   Triage Vitals: BP 136/71  Pulse 56  Temp(Src) 98.2 F (36.8 C) (Oral)  Resp 16  Ht  (1.651 m)  Wt 190 lb (86.183 kg)  BMI 31.62 kg/m2  SpO2 100%  LMP 07/07/2014 Physical Exam  Nursing note and vitals reviewed. Constitutional: She is oriented to person, place, and time. She appears well-developed and well-nourished.  HENT:  Head: Normocephalic and atraumatic.  Right Ear: External ear normal.  Left Ear: External ear normal.  Nose: Nose normal.  Mouth/Throat: Oropharynx is clear and moist.  Eyes: Conjunctivae and EOM are normal. Pupils are equal, round, and reactive to light.  Neck: Normal range of motion. Neck supple. No JVD present. No tracheal deviation present. No thyromegaly present.  Cardiovascular: Normal rate, regular rhythm, normal heart sounds and intact distal pulses.   Pulmonary/Chest: Effort normal and breath sounds normal. No respiratory distress. She has no wheezes.  Abdominal: Soft. Bowel sounds are normal. She exhibits no  mass. There is no tenderness. There is no guarding.  Genitourinary: Cervix exhibits no motion tenderness. Vaginal discharge found.  Musculoskeletal: Normal range of motion.  Lymphadenopathy:    She has no cervical adenopathy.  Neurological: She is alert and oriented to person, place, and time. She has normal reflexes. No cranial nerve deficit or sensory deficit. Gait normal. GCS eye subscore is 4. GCS verbal subscore is 5. GCS motor subscore is 6.  Reflex Scores:      Bicep reflexes  are 2+ on the right side and 2+ on the left side.      Patellar reflexes are 2+ on the right side and 2+ on the left side. Strength is 5/5 bilateral elbow flexor/extensors, wrist extension/flexion, intrinsic hand strength equal Bilateral hip flexion/extension 5/5, knee flexion/extension 5/5, ankle 5/5 flexion extension  Skin: Skin is warm and dry.  Psychiatric: She has a normal mood and affect. Her behavior is normal. Judgment and thought content normal.   ED Course  Procedures (including critical care time) DIAGNOSTIC STUDIES: Oxygen Saturation is 100% on RA, normal by my interpretation.    COORDINATION OF CARE: 6:46 PM-Discussed treatment plan which includes pelvic exam with pt at bedside and pt agreed to plan.   Labs Review Labs Reviewed  WET PREP, GENITAL - Abnormal; Notable for the following:    Trich, Wet Prep MANY (*)    Clue Cells Wet Prep HPF POC MANY (*)    WBC, Wet Prep HPF POC MANY (*)    All other components within normal limits  URINALYSIS, ROUTINE W REFLEX MICROSCOPIC - Abnormal; Notable for the following:    APPearance CLOUDY (*)    Leukocytes, UA SMALL (*)    All other components within normal limits  URINE MICROSCOPIC-ADD ON - Abnormal; Notable for the following:    Squamous Epithelial / LPF FEW (*)    Bacteria, UA FEW (*)    All other components within normal limits  GC/CHLAMYDIA PROBE AMP  PREGNANCY, URINE   Imaging Review No results found.   EKG Interpretation None      MDM   Final diagnoses:  Trichomonas vaginalis (TV) infection  BV (bacterial vaginosis)    Patient with complaints of vaginal discharge.  Denies exposure to std.  Pelvic exam with some discharge but no cervical motion tenderness.  Patient with trich and clue cells, plan treat for std also.  Discussed with patient.  I personally performed the services described in this documentation, which was scribed in my presence. The recorded information has been reviewed and is accurate.   I  personally performed the services described in this documentation, which was scribed in my presence. The recorded information has been reviewed and considered.    Hilario Quarry, MD 07/10/14 2024

## 2014-07-11 LAB — GC/CHLAMYDIA PROBE AMP
CT PROBE, AMP APTIMA: NEGATIVE
GC Probe RNA: NEGATIVE

## 2015-01-18 ENCOUNTER — Emergency Department (HOSPITAL_BASED_OUTPATIENT_CLINIC_OR_DEPARTMENT_OTHER): Payer: Self-pay

## 2015-01-18 ENCOUNTER — Other Ambulatory Visit: Payer: Self-pay

## 2015-01-18 ENCOUNTER — Encounter (HOSPITAL_BASED_OUTPATIENT_CLINIC_OR_DEPARTMENT_OTHER): Payer: Self-pay

## 2015-01-18 ENCOUNTER — Emergency Department (HOSPITAL_BASED_OUTPATIENT_CLINIC_OR_DEPARTMENT_OTHER)
Admission: EM | Admit: 2015-01-18 | Discharge: 2015-01-18 | Disposition: A | Discharge status: Home / Self Care | Payer: Self-pay | Attending: Emergency Medicine | Admitting: Emergency Medicine

## 2015-01-18 DIAGNOSIS — R091 Pleurisy: Secondary | ICD-10-CM | POA: Insufficient documentation

## 2015-01-18 DIAGNOSIS — Z792 Long term (current) use of antibiotics: Secondary | ICD-10-CM | POA: Insufficient documentation

## 2015-01-18 DIAGNOSIS — Z720 Tobacco use: Secondary | ICD-10-CM | POA: Insufficient documentation

## 2015-01-18 LAB — D-DIMER, QUANTITATIVE (NOT AT ARMC): D-Dimer, Quant: <0.27 ug/mL-FEU (ref 0.00–0.48)

## 2015-01-18 MED ORDER — IBUPROFEN 800 MG PO TABS: 800.0000 mg | ORAL_TABLET | Freq: Three times a day (TID) | ORAL | Status: DC | PRN

## 2015-01-18 NOTE — ED Provider Notes (Signed)
CSN: [id number]     Arrival date & time 01/18/15  1608 History   First MD Initiated Contact with Patient 01/18/15 1621     Chief Complaint  Patient presents with  . Chest Pain     (Consider location/radiation/quality/duration/timing/severity/associated sxs/prior Treatment) HPI   Patient presents with central chest tightness with occasional radiation under her left breast that began three days ago.  Associated SOB.  The pain is worse with deep inspiration.  Described as tightness.  Also associated nausea.  Denies cough, leg swelling, recent immobilization, exogenous estrogen, personal or family hx blood clots.  She did have a viral respiratory illness that ended sometime in the past week.  NO trauma to the chest, no heavy lifting or injury.   Past Medical History  Diagnosis Date  . Back pain    History reviewed. No pertinent past surgical history. No family history on file. History  Substance Use Topics  . Smoking status: Current Every Day Smoker -- 0.10 packs/day  . Smokeless tobacco: Not on file  . Alcohol Use: Yes     Comment: occ   OB History    No data available     Review of Systems  All other systems reviewed and are negative.     Allergies  Review of patient's allergies indicates no known allergies.  Home Medications   Prior to Admission medications   Medication Sig Start Date End Date Taking? Authorizing Provider  ibuprofen (ADVIL,MOTRIN) 600 MG tablet Take 1 tablet (600 mg total) by mouth every 6 (six) hours as needed. 03/28/14   Loren Raceravid Yelverton, MD  methocarbamol (ROBAXIN) 500 MG tablet Take 1 tablet (500 mg total) by mouth every 8 (eight) hours as needed for muscle spasms. 03/28/14   Loren Raceravid Yelverton, MD  metroNIDAZOLE (FLAGYL) 500 MG tablet Take 1 tablet (500 mg total) by mouth 2 (two) times daily. 08/13/12   Renne CriglerJoshua Geiple, PA-C   BP 133/87 mmHg  Pulse 56  Temp(Src) 98.2 F (36.8 C) (Oral)  Resp 16  Ht 5\' 5"  (1.651 m)  Wt 180 lb (81.647 kg)  BMI 29.95  kg/m2  SpO2 100%  LMP 01/13/2015 Physical Exam  Constitutional: She appears well-developed and well-nourished. No distress.  HENT:  Head: Normocephalic and atraumatic.  Neck: Neck supple.  Cardiovascular: Normal rate and regular rhythm.   Pulmonary/Chest: Effort normal and breath sounds normal. No respiratory distress. She has no wheezes. She has no rales. She exhibits no tenderness.  Abdominal: Soft. She exhibits no distension. There is no tenderness. There is no rebound and no guarding.  Neurological: She is alert.  Skin: She is not diaphoretic.  Nursing note and vitals reviewed.   ED Course  Procedures (including critical care time) Labs Review Labs Reviewed  D-DIMER, QUANTITATIVE    Imaging Review Dg Chest 2 View  01/18/2015   CLINICAL DATA:  Chest tightness with shortness of breath. Smoker. Initial encounter.  EXAM: CHEST  2 VIEW  COMPARISON:  08/20/2010 radiographs.  FINDINGS: The heart size and mediastinal contours are normal. The lungs are clear. There is no pleural effusion or pneumothorax. No acute osseous findings are identified. Thoracolumbar scoliosis again noted.  IMPRESSION: Stable chest.  No active cardiopulmonary process.  Scoliosis.   Electronically Signed   By: Carey BullocksWilliam  Veazey M.D.   On: 01/18/2015 16:53     EKG Interpretation   Date/Time:  Thursday January 18 2015 16:14:19 EST Ventricular Rate:  53 PR Interval:  150 QRS Duration: 92 QT Interval:  398 QTC Calculation: 373  R Axis:   64 Text Interpretation:  Sinus bradycardia with sinus arrhythmia Otherwise  normal ECG ST elevations not present from 2011 Confirmed by GOLDSTON  MD,  SCOTT ([id number]) on 01/18/2015 4:22:59 PM       PERC negative.   MDM   Final diagnoses:  Pleurisy    Afebrile, nontoxic patient with pleuritic chest pain x 3 days.  CXR negative, EKG unremarkable, dimer negative.  PERC negative.  O2 100%.  Pt in no distress.   D/C home with ibuprofen, PCP resources for follow up.  Discussed  result, findings, treatment, and follow up  with patient.  Pt given return precautions.  Pt verbalizes understanding and agrees with plan.         Trixie Dredge, PA-C 01/18/15 1757  Pricilla Loveless, MD 01/23/15 0005

## 2015-01-18 NOTE — ED Notes (Signed)
Pt reports tightness in chest since Tuesday. Deep breathing worsens pain.

## 2015-01-18 NOTE — ED Notes (Signed)
Patient transported to X-ray 

## 2015-01-18 NOTE — Discharge Instructions (Signed)
Read the information below.  Use the prescribed medication as directed.  Please discuss all new medications with your pharmacist.  You may return to the Emergency Department at any time for worsening condition or any new symptoms that concern you.    If there is any possibility that you might be pregnant, please let your health care provider know and discuss this with the pharmacist to ensure medication safety.  If you develop worsening chest pain, shortness of breath, fever, you pass out, or become weak or dizzy, return to the ER for a recheck.      Pleurisy Pleurisy is an inflammation and swelling of the lining of the lungs (pleura). Because of this inflammation, it hurts to breathe. It can be aggravated by coughing, laughing, or deep breathing. Pleurisy is often caused by an underlying infection or disease.  HOME CARE INSTRUCTIONS  Monitor your pleurisy for any changes. The following actions may help to alleviate any discomfort you are experiencing:  Medicine may help with pain. Only take over-the-counter or prescription medicines for pain, discomfort, or fever as directed by your health care provider.  Only take antibiotic medicine as directed. Make sure to finish it even if you start to feel better. SEEK MEDICAL CARE IF:   Your pain is not controlled with medicine or is increasing.  You have an increase in pus-like (purulent) secretions brought up with coughing. SEEK IMMEDIATE MEDICAL CARE IF:   You have blue or dark lips, fingernails, or toenails.  You are coughing up blood.  You have increased difficulty breathing.  You have continuing pain unrelieved by medicine or pain lasting more than 1 week.  You have pain that radiates into your neck, arms, or jaw.  You develop increased shortness of breath or wheezing.  You develop a fever, rash, vomiting, fainting, or other serious symptoms. MAKE SURE YOU:  Understand these instructions.   Will watch your condition.   Will get  help right away if you are not doing well or get worse.  Document Released: 11/03/2005 Document Revised: 07/06/2013 Document Reviewed: 04/17/2013 [facility] Patient Information 2015 [place], [place]. This information is not intended to replace advice given to you by your health care provider. Make sure you discuss any questions you have with your health care provider.   Emergency Department Resource Guide 1) Find a Doctor and Pay Out of Pocket Although you won't have to find out who is covered by your insurance plan, it is a good idea to ask around and get recommendations. You will then need to call the office and see if the doctor you have chosen will accept you as a new patient and what types of options they offer for patients who are self-pay. Some doctors offer discounts or will set up payment plans for their patients who do not have insurance, but you will need to ask so you aren't surprised when you get to your appointment.  2) Contact Your Local Health Department Not all health departments have doctors that can see patients for sick visits, but many do, so it is worth a call to see if yours does. If you don't know where your local health department is, you can check in your phone book. The CDC also has a tool to help you locate your state's health department, and many state websites also have listings of all of their local health departments.  3) Find a Walk-in Clinic If your illness is not likely to be very severe or complicated, you may want to  try a walk in clinic. These are popping up all over the country in pharmacies, drugstores, and shopping centers. They're usually staffed by nurse practitioners or physician assistants that have been trained to treat common illnesses and complaints. They're usually fairly quick and inexpensive. However, if you have serious medical issues or chronic medical problems, these are probably not your best option.  No Primary Care Doctor: - Call Health Connect  at  [phone] - they can help you locate a primary care doctor that  accepts your insurance, provides certain services, etc. - Physician Referral Service- [phone]  Chronic Pain Problems: Organization         Address  Phone   Notes  [facility]  [phone] Patients need to be referred by their primary care doctor.   Medication Assistance: Organization         Address  Phone   Notes  [facility] Medication [facility] [address] [place], [place] [address] [phone] --Must be a resident of [facility] -- Must have NO insurance coverage whatsoever (no Medicaid/ Medicare, etc.) -- The pt. MUST have a primary care doctor that directs their care regularly and follows them in the community   [facility]ist  [phone]   [employer]  [phone]    Agencies that provide inexpensive medical care: Organization         Address  Phone   Notes  [facility]  [phone]   [facility]    [phone]   [facility] [address] [place], [place] [address] [phone]   [facility] [address] [place] [place] [phone]   Planned Parenthood    [phone]   [facility]    [phone]   [facility]  [address], [place] Phone:  [phone], Fax:  [phone] Hours of Operation:  9 am - 6 pm, M-F.  Also accepts Medicaid/Medicare and self-pay.  [facility]  [address], [place] Phone: [phone], Fax: [phone]. Hours of Operation:  8:30 am - 5:30 pm, M-F.  Also accepts Medicaid and self-pay.  [facility] [place] [address] [place] Phone: [phone]   [facility] [address] Natasha Bence [place], [place] [phone], Ext. [phone] Mondays & Thursdays: 7-9 AM.  First 15 patients are seen on a first come, first serve basis.     Medicaid-accepting [facility] Providers:  Organization         Address  Phone   Notes  [facility] [address], [place] [phone] Also accepts self-pay patients.  [facility] [address] Laurell Josephs [place], [place]  [phone]   [facility] [address], [place] [phone]   [facility] [address], [place] [phone]   Renaye Rakers [address], [place]   [phone] Only accepts [place] patients after they have their name applied to their card.   Self-Pay (no insurance) in [facility]:  Organization         Address  Phone   Notes  Sickle Cell Patients, [facility] Internal Medicine [address] [place], [place] [phone]   [facility] [address] [place], [place] [phone]  [facility] [place]  [address], [place] [phone]   [facility]/Dr. Osei-Bonsu  [address], [place] or [address], [place] [phone] Phone number for both [place] and [place] locations is the same.  [facility] [address] [place] [phone]   [facility] [address] [place] or [address] [phone]   [facility] [address] [place] [phone], phone; [phone], fax Sees patients 1st and 3rd Saturday of every month.  Must not qualify for public or private insurance (i.e. Medicaid, Medicare, [place] Health Choice, Veterans' Benefits)  Household income should be no more than 200% of the poverty level The clinic cannot treat you if you are pregnant or think you are pregnant  Sexually transmitted diseases are not treated at the clinic.    Dental Care: Organization         Address  Phone  Notes  [facility]  Department of [facility] [address] [place], [place] [phone] Accepts children up to age 23 who are enrolled in [place] or [place] Health Choice; pregnant women with a Medicaid card; and children who have applied for Medicaid or [place] Health Choice, but were declined, whose parents can pay a reduced fee at time of service.  [facility] Department of [facility]  [address], [place] [phone] Accepts children up to age 11 who are enrolled in [place] or [place] Health Choice; pregnant women with a Medicaid card; and children who have applied for Medicaid or [place] Health Choice, but were declined, whose parents can pay a reduced fee at time of service.  [facility]  [address] [place], [place] [phone] Patients are seen by appointment only. Walk-ins are not accepted. [facility] will see patients 55 years of age and older. Monday - Tuesday (8am-5pm) Most Wednesdays (8:30-5pm) $30 per visit, cash only  [facility]  [address], [facility] [phone] Patients are seen by appointment only. Walk-ins are not accepted. [facility] will see patients 60 years of age and older. One Wednesday Evening (Monthly: Volunteer Based).  $30 per visit, cash only  [employer]  [phone] for adults; Children under age 3, call Graduate Pediatric Dentistry at [phone]. Children aged 46-14, please call [phone] to request a pediatric application.  Dental services are provided in all areas of dental care including fillings, crowns and bridges, complete and partial dentures, implants, gum treatment, root canals, and extractions. Preventive care is also provided. Treatment is provided to both adults and children. Patients are selected via a lottery and there is often a waiting list.   [facility] [address] [place]  [phone]  [email or web address]   [facility] [address] [place], [place] [phone], Ext. [phone] Second and Fourth Thursday of each month, opens at 6:30 AM; Clinic ends at 9 AM.  Patients are seen on a first-come first-served basis, and a limited number are seen during each clinic.   [facility]  [address] Ether Griffins [place], [place] [phone]   Eligibility Requirements You must have lived in [place], [place], or [place] counties for at least the last three months.   You cannot be eligible for state or federal sponsored  healthcare insurance, including CIGNAVeterans Administration, [place]icaid, or [employer]icare.   You generally cannot be eligible for healthcare insurance through your employer.    How to apply: Eligibility screenings are held every Tuesday and Wednesday afternoon from 1:00 pm until 4:00 pm. You do not need an appointment for the interview!  [facility] [address], [place], [place] [phone]   [facility]  [phone]   [facility]  [phone]   [facility]  [phone]    Behavioral Health Resources in the Community: Intensive Outpatient Programs Organization         Address  Phone  Notes  [facility] [address], [place], [place] [phone]   [facility] [address], [place], [place] [phone]   [facility] [address], [place], [place]  [phone]   [facility] [address],  [place], [place] [phone] or [phone]   Substance Abuse Resources Organization         Address  Phone  Notes  [facility]  [phone]   [facility]  [phone]   [facility]  [phone]   [facility]  [phone]   Residential & Outpatient Substance Abuse Program  [phone]   Psychological Services Organization          Address  Phone  Notes  [facility]  [phone]- [phone]   [facility]  [phone]- [phone]   [facility] [address], [place] [phone] or [phone]    Mobile Crisis Teams Organization         Address  Phone  Notes  [facility], Mobile Crisis Care Unit  [phone]   [facility]  [address] [place], [place] [phone]   Doristine LocksSharon DeEsch [address] [place] [phone]    Self-Help/Support Groups Organization         Address  Phone             Notes  [facility] [place] - variety of support groups  336- [phone] Call for more information  Narcotics Anonymous (NA), [facility] [address], [place]  2 meetings at this location   Statisticianesidential Treatment Programs Organization         Address  Phone  Notes  [facility] [address],    [place]  [phone]   [facility]  [address], [place] [phone], [place], [place] [phone]   [facility] [address], [place] [phone] Admissions: 8am-3pm M-F  [facility] [address]    [place], [place] [phone]   [facility] [address], [place]ashida [place], [place] [phone]   [facility] [address]  [place], [place] [phone]   [facility] - Intensive Outpatient [address], [place], [place] [phone]   [facility] ([facility] [address]  [place], [place] [phone] or [phone]   [facility] (RT[facility]) [address] [place], [place] [phone] Accepts Medicaid  [facility] [address]  [place] [phone] Substance Abuse/Addiction Treatment   [facility] Resources Organization         Address  Phone  Notes  [facility]  [phone]) [phone]   Angie FavaJulie Brannon, PhD [address] Mervyn Skeeters [place], [place]   [phone](336) [phone]  or [phone]   [facility]   [address] [place], [place] [phone]) [phone]   [facility] [address], [place], [place] [phone] Insurance/Medicaid/sponsorship through [facility]  [facility] [address]                                    [place], [place] [phone] Therapy/tele-psych/case  [facility] [address]   [place], [place] [phone]    Dr. Lolly MustacheArfeen  [phone]   [facility]  [employer] [facility] 1) [address], [place] 2) [address], [place] 3)  [address], [place] [phone](336) [phone]  [phone]   [facility] Child Abuse Hotline [phone](336) [phone] or [phone](336) [phone] (After Hours)

## 2016-08-28 ENCOUNTER — Emergency Department (HOSPITAL_BASED_OUTPATIENT_CLINIC_OR_DEPARTMENT_OTHER)
Admission: EM | Admit: 2016-08-28 | Discharge: 2016-08-28 | Disposition: A | Discharge status: Home / Self Care | Payer: No Typology Code available for payment source | Attending: Emergency Medicine | Admitting: Emergency Medicine

## 2016-08-28 ENCOUNTER — Encounter (HOSPITAL_BASED_OUTPATIENT_CLINIC_OR_DEPARTMENT_OTHER): Payer: Self-pay | Admitting: Emergency Medicine

## 2016-08-28 DIAGNOSIS — F17200 Nicotine dependence, unspecified, uncomplicated: Secondary | ICD-10-CM | POA: Insufficient documentation

## 2016-08-28 DIAGNOSIS — Z79899 Other long term (current) drug therapy: Secondary | ICD-10-CM | POA: Insufficient documentation

## 2016-08-28 DIAGNOSIS — L989 Disorder of the skin and subcutaneous tissue, unspecified: Secondary | ICD-10-CM | POA: Insufficient documentation

## 2016-08-28 MED ORDER — TRIAMCINOLONE ACETONIDE 0.1 % EX CREA: TOPICAL_CREAM | CUTANEOUS | 0 refills | Status: DC

## 2016-08-28 NOTE — ED Provider Notes (Addendum)
[facility] Provider Note: Lowella DellJ. Lane Montine Hight, MD, FACEP  CSN: [id number] MRN: [id number] ARRIVAL: 08/28/16 at 0023   CHIEF COMPLAINT  Rash   HISTORY OF PRESENT ILLNESS  Anita Gomez is a 30 y.o. female with a three-day history of tender, pruritic, erythematous plaques on her right lower extremity. These are associated with central shallow ulcerations. She denies any lesions on the left leg, trunk or upper extremities. Symptoms are moderate. She has had partial relief with Benadryl. She denies any associated symptoms such as nausea, vomiting, shortness of breath or throat swelling. She is not aware of being bitten or stung by insects.   Past Medical History:  Diagnosis Date  . Back pain     History reviewed. No pertinent surgical history.  No family history on file.  Social History  Substance Use Topics  . Smoking status: Current Every Day Smoker    Packs/day: 0.10  . Smokeless tobacco: Never Used  . Alcohol use Yes     Comment: occ    Prior to Admission medications   Medication Sig Start Date End Date Taking? Authorizing Provider  ibuprofen (ADVIL,MOTRIN) 800 MG tablet Take 1 tablet (800 mg total) by mouth every 8 (eight) hours as needed for mild pain or moderate pain. 01/18/15   Trixie DredgeEmily West, PA-C  methocarbamol (ROBAXIN) 500 MG tablet Take 1 tablet (500 mg total) by mouth every 8 (eight) hours as needed for muscle spasms. 03/28/14   Loren Raceravid Yelverton, MD  metroNIDAZOLE (FLAGYL) 500 MG tablet Take 1 tablet (500 mg total) by mouth 2 (two) times daily. 08/13/12   Renne CriglerJoshua Geiple, PA-C    Allergies Review of patient's allergies indicates no known allergies.   REVIEW OF SYSTEMS  Negative except as noted here or in the History of Present Illness.   PHYSICAL EXAMINATION  Initial Vital Signs Blood pressure 139/91, pulse 64, temperature 98.3 F (36.8 C), temperature source Oral, resp. rate 18, height 5\' 5"  (1.651 m), weight 180 lb (81.6 kg), SpO2 100  %.  Examination General: Well-developed, well-nourished female in no acute distress; appearance consistent with age of record HENT: normocephalic; atraumatic Eyes: Normal appearance Neck: supple Heart: regular rate and rhythm Lungs: clear to auscultation bilaterally Abdomen: soft; nondistended Extremities: No deformity; full range of motion; pulses normal Neurologic: Awake, alert and oriented; motor function intact in all extremities and symmetric; no facial droop Skin: Warm and dry; tender, centrally ulcerated plaques of the right lower extremity:   Psychiatric: Normal mood and affect   RESULTS  Summary of this visit's results, reviewed by myself:   EKG Interpretation  Date/Time:    Ventricular Rate:    PR Interval:    QRS Duration:   QT Interval:    QTC Calculation:   R Axis:     Text Interpretation:        Laboratory Studies: No results found for this or any previous visit (from the past 24 hour(s)). Imaging Studies: No results found.  ED COURSE  Nursing notes and initial vitals signs, including pulse oximetry, reviewed.  Vitals:   08/28/16 0028  BP: 139/91  Pulse: 64  Resp: 18  Temp: 98.3 F (36.8 C)  TempSrc: Oral  SpO2: 100%  Weight: 180 lb (81.6 kg)  Height: 5\' 5"  (1.651 m)   I doubt these represent erythema nodosum as erythema nodosum is not associated with ulcerations. Also the onset was fairly sudden. They appear most consistent with insect bites or stings but the unilaterality is puzzling. We will treat  with a steroid cream but she was advised that if they persist or worsen she may need to see a dermatologist for definitive biopsy. She was advised to continue Benadryl.  PROCEDURES    ED DIAGNOSES     ICD-9-CM ICD-10-CM   1. Skin lesions 709.9 L98.9        Paula Libra, MD 08/28/16 0981    Paula Libra, MD 08/28/16 1914

## 2016-08-28 NOTE — ED Triage Notes (Signed)
Red, raised bumps on right leg with itching and tenderness.

## 2017-04-09 ENCOUNTER — Emergency Department (HOSPITAL_BASED_OUTPATIENT_CLINIC_OR_DEPARTMENT_OTHER): Payer: Medicaid Other

## 2017-04-09 ENCOUNTER — Emergency Department (HOSPITAL_BASED_OUTPATIENT_CLINIC_OR_DEPARTMENT_OTHER)
Admission: EM | Admit: 2017-04-09 | Discharge: 2017-04-09 | Disposition: A | Discharge status: Home / Self Care | Payer: Medicaid Other | Attending: Emergency Medicine | Admitting: Emergency Medicine

## 2017-04-09 ENCOUNTER — Encounter (HOSPITAL_BASED_OUTPATIENT_CLINIC_OR_DEPARTMENT_OTHER): Payer: Self-pay

## 2017-04-09 DIAGNOSIS — Z3A00 Weeks of gestation of pregnancy not specified: Secondary | ICD-10-CM | POA: Insufficient documentation

## 2017-04-09 DIAGNOSIS — N760 Acute vaginitis: Secondary | ICD-10-CM

## 2017-04-09 DIAGNOSIS — F1290 Cannabis use, unspecified, uncomplicated: Secondary | ICD-10-CM | POA: Diagnosis not present

## 2017-04-09 DIAGNOSIS — O4691 Antepartum hemorrhage, unspecified, first trimester: Secondary | ICD-10-CM | POA: Insufficient documentation

## 2017-04-09 DIAGNOSIS — B9689 Other specified bacterial agents as the cause of diseases classified elsewhere: Secondary | ICD-10-CM

## 2017-04-09 DIAGNOSIS — Z87891 Personal history of nicotine dependence: Secondary | ICD-10-CM | POA: Diagnosis not present

## 2017-04-09 DIAGNOSIS — O209 Hemorrhage in early pregnancy, unspecified: Secondary | ICD-10-CM

## 2017-04-09 DIAGNOSIS — N939 Abnormal uterine and vaginal bleeding, unspecified: Secondary | ICD-10-CM | POA: Diagnosis present

## 2017-04-09 LAB — URINALYSIS, ROUTINE W REFLEX MICROSCOPIC
Bilirubin Urine: NEGATIVE
Glucose, UA: NEGATIVE mg/dL
Ketones, ur: NEGATIVE mg/dL
Leukocytes, UA: NEGATIVE
NITRITE: NEGATIVE
PH: 6.5 (ref 5.0–8.0)
Protein, ur: NEGATIVE mg/dL
SPECIFIC GRAVITY, URINE: 1.011 (ref 1.005–1.030)

## 2017-04-09 LAB — ABO/RH: ABO/RH(D): A POS

## 2017-04-09 LAB — WET PREP, GENITAL
Sperm: NONE SEEN
Trich, Wet Prep: NONE SEEN
Yeast Wet Prep HPF POC: NONE SEEN

## 2017-04-09 LAB — URINALYSIS, MICROSCOPIC (REFLEX): WBC, UA: NONE SEEN WBC/hpf (ref 0–5)

## 2017-04-09 LAB — HCG, QUANTITATIVE, PREGNANCY: hCG, Beta Chain, Quant, S: 11658 m[IU]/mL — ABNORMAL HIGH (ref <5)

## 2017-04-09 LAB — PREGNANCY, URINE: Preg Test, Ur: POSITIVE — AB

## 2017-04-09 MED ORDER — METRONIDAZOLE 500 MG PO TABS: 500.0000 mg | ORAL_TABLET | Freq: Two times a day (BID) | ORAL | 0 refills | Status: DC

## 2017-04-09 MED FILL — metroNIDAZOLE 500 MG TABS: 500 | 7 days supply | Qty: 14 | Fill #0

## 2017-04-09 NOTE — Discharge Instructions (Signed)
You presented today with vaginal spotting during her first trimester pregnancy. You have what appears to be an intrauterine pregnancy, but no cardiac activity is visualized at this point. It is recommended that she follow-up in 2 weeks for repeat blood tests and ultrasound. If you have any new or worsening signs or symptoms return immediately to the emergency room at [facility] for [facility]. [facility] and schedule outpatient follow-up. Please take medication as directed for bacterial vaginosis.

## 2017-04-09 NOTE — ED Triage Notes (Signed)
C/o vaginal spotting x 2 days-LMP 4/15-pos home preg test

## 2017-04-09 NOTE — ED Provider Notes (Signed)
[facility] Provider Note   CSN: [id number] Arrival date & time: 04/09/17  1125     History   Chief Complaint Chief Complaint  Patient presents with  . Vaginal Bleeding    HPI Anita Gomez is a 31 y.o. female.  HPI  G21 P66 A30 31 year old female presents today with complaints of positive pregnancy test and vaginal spotting. Patient notes her LMP was on 03/01/2017. She notes 2 days of very minor spotting and positive pregnancy test at home. Yesterday she notes very minor abdominal discomfort that was brief and has not had any abdominal pain since. She denies any fever, nausea or vomiting. Patient does note some breast tenderness and swelling. Patient reports she smokes marijuana, does not use any other drugs. No abdominal trauma.  Past Medical History:  Diagnosis Date  . Back pain     There are no active problems to display for this patient.   History reviewed. No pertinent surgical history.  OB History    No data available       Home Medications    Prior to Admission medications   Medication Sig Start Date End Date Taking? Authorizing Provider  metroNIDAZOLE (FLAGYL) 500 MG tablet Take 1 tablet (500 mg total) by mouth 2 (two) times daily. 04/09/17   Eyvonne Mechanic, PA-C    Family History No family history on file.  Social History Social History  Substance Use Topics  . Smoking status: Former Smoker    Packs/day: 0.10  . Smokeless tobacco: Never Used  . Alcohol use No     Comment: occ    Allergies   Iodine and Shellfish allergy   Review of Systems Review of Systems  All other systems reviewed and are negative.  Physical Exam Updated Vital Signs BP 117/61 (BP Location: Right Arm)   Pulse (!) 55   Temp 98.9 F (37.2 C) (Oral)   Resp 19   Ht 5\' 5"  (1.651 m)   Wt 77.1 kg (170 lb)   LMP 03/01/2017   SpO2 100%   BMI 28.29 kg/m    Physical Exam  Constitutional: She is oriented to person, place, and time. She appears  well-developed and well-nourished.  HENT:  Head: Normocephalic and atraumatic.  Eyes: Conjunctivae are normal. Pupils are equal, round, and reactive to light. Right eye exhibits no discharge. Left eye exhibits no discharge. No scleral icterus.  Neck: Normal range of motion. No JVD present. No tracheal deviation present.  Pulmonary/Chest: Effort normal. No stridor.  Abdominal: Soft. She exhibits no distension and no mass. There is no tenderness. There is no rebound and no guarding. No hernia.  Genitourinary:  Genitourinary Comments: Small amount of white discharge- cervix closed with no active bleeding, no CMT, no adnexal tenderness   Neurological: She is alert and oriented to person, place, and time. Coordination normal.  Psychiatric: She has a normal mood and affect. Her behavior is normal. Judgment and thought content normal.  Nursing note and vitals reviewed.   ED Treatments / Results  Labs (all labs ordered are listed, but only abnormal results are displayed) Labs Reviewed  WET PREP, GENITAL - Abnormal; Notable for the following:       Result Value   Clue Cells Wet Prep HPF POC PRESENT (*)    WBC, Wet Prep HPF POC MANY (*)    All other components within normal limits  PREGNANCY, URINE - Abnormal; Notable for the following:    Preg Test, Ur POSITIVE (*)    All  other components within normal limits  URINALYSIS, ROUTINE W REFLEX MICROSCOPIC - Abnormal; Notable for the following:    Hgb urine dipstick TRACE (*)    All other components within normal limits  HCG, QUANTITATIVE, PREGNANCY - Abnormal; Notable for the following:    hCG, Beta Chain, Quant, S 11,658 (*)    All other components within normal limits  URINALYSIS, MICROSCOPIC (REFLEX) - Abnormal; Notable for the following:    Bacteria, UA MANY (*)    Squamous Epithelial / LPF 6-30 (*)    All other components within normal limits  ABO/RH  GC/CHLAMYDIA PROBE AMP ([place]) NOT AT [facility]    EKG  EKG Interpretation None         Radiology US Ob Comp Less 14 Wks  Result Date: 04/09/2017 CLINICAL DATA:  31 year old female with light spotting for 2 days. Gestational age by LMP 5 weeks 4 days. EXAM: OBSTETRIC <14 WK [place] AND TRANSVAGINAL OB US TECHNIQUE: Both transabdominal and transvaginal ultrasound examinations were performed for complete evaluation of the gestation as well as the maternal uterus, adnexal regions, and pelvic cul-de-sac. Transvaginal technique was performed to assess early pregnancy. COMPARISON:  Pelvis ultrasound 11/02/2010. FINDINGS: Intrauterine gestational sac: Single Yolk sac:  Visible Embryo:  Visible Cardiac Activity: Indeterminate CRL:  2.3  mm   5 w   5 d Subchorionic hemorrhage:  Small volume (image 76). Maternal uterus/adnexae: Normal right ovary measuring 4.7 x 2.4 x 2.2 cm. The left ovary measures 4.6 x 2.4 x 3.1 cm and probably contains the corpus luteum (Image 55). Trace simple appearing pelvic free fluid. IMPRESSION: 1. Intrauterine gestational sac, with yolk sac, and suspected fetal pole, but no definite cardiac activity yet visualized. Recommend follow-up quantitative B-HCG levels and follow-up US in 14 days to assess viability. 2. Small volume subchorionic hemorrhage. Trace simple appearing pelvic free fluid. Electronically Signed   By: Odessa Fleming M.D.   On: 04/09/2017 13:29   US Ob Transvaginal  Result Date: 04/09/2017 CLINICAL DATA:  31 year old female with light spotting for 2 days. Gestational age by LMP 5 weeks 4 days. EXAM: OBSTETRIC <14 WK [place] AND TRANSVAGINAL OB US TECHNIQUE: Both transabdominal and transvaginal ultrasound examinations were performed for complete evaluation of the gestation as well as the maternal uterus, adnexal regions, and pelvic cul-de-sac. Transvaginal technique was performed to assess early pregnancy. COMPARISON:  Pelvis ultrasound 11/02/2010. FINDINGS: Intrauterine gestational sac: Single Yolk sac:  Visible Embryo:  Visible Cardiac Activity: Indeterminate CRL:  2.3   mm   5 w   5 d Subchorionic hemorrhage:  Small volume (image 76). Maternal uterus/adnexae: Normal right ovary measuring 4.7 x 2.4 x 2.2 cm. The left ovary measures 4.6 x 2.4 x 3.1 cm and probably contains the corpus luteum (Image 55). Trace simple appearing pelvic free fluid. IMPRESSION: 1. Intrauterine gestational sac, with yolk sac, and suspected fetal pole, but no definite cardiac activity yet visualized. Recommend follow-up quantitative B-HCG levels and follow-up US in 14 days to assess viability. 2. Small volume subchorionic hemorrhage. Trace simple appearing pelvic free fluid. Electronically Signed   By: Odessa Fleming M.D.   On: 04/09/2017 13:29    Procedures Procedures (including critical care time)  Medications Ordered in ED Medications - No data to display   Initial Impression / Assessment and Plan / ED Course  I have reviewed the triage vital signs and the nursing notes.  Pertinent labs & imaging results that were available during my care of the patient were reviewed by me  and considered in my medical decision making (see chart for details).     Final Clinical Impressions(s) / ED Diagnoses   Final diagnoses:  First trimester bleeding  Bacterial vaginosis    Labs: Wet prep, hcg, ABO/Rh, Preg, pregnancy, UA  Imaging: US-OB transvag  Consults:  Therapeutics:  Discharge Meds: Metronidazole  Assessment/Plan: 31 year old female presents today with first trimester vaginal bleeding. She has been very normal blood loss, no bleeding here. OB ultrasound shows intrauterine pregnancy no fetal activity at this point. Patient is not a candidate for Rhogam. She has no abdominal tenderness. Vital signs stable. All results including ultrasound findings read to the patient. Patient will [facility] in 2 weeks for repeat ultrasound and hCG testing. Gonorrhea and Chlamydia testing pending. Patient does have bacterial vaginosis and will be treated appropriately. She is given  return precautions, she verbalized understanding and agreement to today's plan had no further questions or concerns.    New Prescriptions Discharge Medication List as of 04/09/2017  1:56 PM    START taking these medications   Details  metroNIDAZOLE (FLAGYL) 500 MG tablet Take 1 tablet (500 mg total) by mouth 2 (two) times daily., Starting Thu 04/09/2017, Print         Tyisha Cressy, [place], PA-C 04/09/17 1542    Lavera GuiseLiu, Dana Duo, MD 04/14/17 [phone]

## 2017-04-14 LAB — GC/CHLAMYDIA PROBE AMP (~~LOC~~) NOT AT ARMC
Chlamydia: NEGATIVE
Neisseria Gonorrhea: NEGATIVE

## 2017-06-19 ENCOUNTER — Encounter (HOSPITAL_BASED_OUTPATIENT_CLINIC_OR_DEPARTMENT_OTHER): Payer: Self-pay

## 2017-06-19 ENCOUNTER — Emergency Department (HOSPITAL_BASED_OUTPATIENT_CLINIC_OR_DEPARTMENT_OTHER)
Admission: EM | Admit: 2017-06-19 | Discharge: 2017-06-19 | Disposition: A | Discharge status: Home / Self Care | Payer: Medicaid Other | Attending: Emergency Medicine | Admitting: Emergency Medicine

## 2017-06-19 ENCOUNTER — Emergency Department (HOSPITAL_BASED_OUTPATIENT_CLINIC_OR_DEPARTMENT_OTHER): Payer: Medicaid Other

## 2017-06-19 DIAGNOSIS — Z3A15 15 weeks gestation of pregnancy: Secondary | ICD-10-CM | POA: Diagnosis not present

## 2017-06-19 DIAGNOSIS — O98812 Other maternal infectious and parasitic diseases complicating pregnancy, second trimester: Secondary | ICD-10-CM | POA: Diagnosis not present

## 2017-06-19 DIAGNOSIS — R102 Pelvic and perineal pain: Secondary | ICD-10-CM | POA: Diagnosis present

## 2017-06-19 DIAGNOSIS — B9689 Other specified bacterial agents as the cause of diseases classified elsewhere: Secondary | ICD-10-CM

## 2017-06-19 DIAGNOSIS — Z87891 Personal history of nicotine dependence: Secondary | ICD-10-CM | POA: Insufficient documentation

## 2017-06-19 DIAGNOSIS — N760 Acute vaginitis: Secondary | ICD-10-CM | POA: Insufficient documentation

## 2017-06-19 HISTORY — DX: Unspecified ovarian cyst, unspecified side: N83.209

## 2017-06-19 LAB — URINALYSIS, ROUTINE W REFLEX MICROSCOPIC
Bilirubin Urine: NEGATIVE
GLUCOSE, UA: NEGATIVE mg/dL
Hgb urine dipstick: NEGATIVE
Ketones, ur: NEGATIVE mg/dL
LEUKOCYTES UA: NEGATIVE
NITRITE: NEGATIVE
PH: 5.5 (ref 5.0–8.0)
Protein, ur: NEGATIVE mg/dL
SPECIFIC GRAVITY, URINE: 1.022 (ref 1.005–1.030)

## 2017-06-19 LAB — WET PREP, GENITAL
SPERM: NONE SEEN
TRICH WET PREP: NONE SEEN
Yeast Wet Prep HPF POC: NONE SEEN

## 2017-06-19 MED ORDER — METRONIDAZOLE 500 MG PO TABS: 500.0000 mg | ORAL_TABLET | Freq: Two times a day (BID) | ORAL | 0 refills | Status: DC

## 2017-06-19 NOTE — ED Provider Notes (Signed)
[facility]   CSN: [id number] Arrival date & time: 06/19/17  1817  By signing my name below, I, Rosario AdieWilliam Andrew Hiatt, attest that this documentation has been prepared under the direction and in the presence of Shaune PollackIsaacs, Yashika Mask, MD. Electronically Signed: Rosario AdieWilliam Andrew Hiatt, ED Scribe. 06/19/17. 7:47 PM.  History   Chief Complaint Chief Complaint  Patient presents with  . Pelvic Pain   The history is provided by the patient. No language interpreter was used.    HPI Comments: Anita Gomez is a G1P0000 31 y.o. female who is 378w2d pregnant, who presents to the Emergency Department complaining of intermittent, sharp left sided pelvic pain beginning approximately 15 hours ago. Pt reports that she was sleeping this morning when she had an acute onset of left sided, sharp pelvic pain which woke her up from sleep. She states that this pain lasted for approximately 30 minutes prior to resolving; her pain resolved and then she had a gradual onset of pain to the same area again approximately 2.5 hours ago. Her pain has now improved and nearly resolved again. Pt also notes that she has recently been constipated, her last bowel movement was last night and was more firm than her baseline. She reports that her current pain feels similar to her prior ovarian cyst; she was previously on birth control for this. Pt is currently followed by an OBGYN and she has had US confirmation of uterine pregnancy. She denies vaginal bleeding/discharge, dyspareunia, dysuria, hematuria, nausea, vomiting, diarrhea, or any other associated symptoms.   Past Medical History:  Diagnosis Date  . Back pain   . Ovarian cyst    There are no active problems to display for this patient.  History reviewed. No pertinent surgical history.  OB History    Gravida Para Term Preterm AB Living   1             SAB TAB Ectopic Multiple Live Births                 Home Medications    Prior to Admission  medications   Medication Sig Start Date End Date Taking? Authorizing Provider  metroNIDAZOLE (FLAGYL) 500 MG tablet Take 1 tablet (500 mg total) by mouth 2 (two) times daily. 06/19/17   Shaune PollackIsaacs, Japleen Tornow, MD   Family History No family history on file.  Social History Social History  Substance Use Topics  . Smoking status: Former Smoker    Packs/day: 0.10  . Smokeless tobacco: Never Used  . Alcohol use No     Comment: occ   Allergies   Iodine and Shellfish allergy  Review of Systems Review of Systems  Gastrointestinal: Positive for abdominal pain. Negative for diarrhea, nausea and vomiting.  Genitourinary: Positive for pelvic pain. Negative for dyspareunia, dysuria, hematuria, vaginal bleeding and vaginal discharge.  All other systems reviewed and are negative.  Physical Exam Updated Vital Signs BP 138/76 (BP Location: Left Arm)   Pulse 68   Temp 98.7 F (37.1 C) (Oral)   Resp 16   Ht 5\' 5"  (1.651 m)   Wt 79.4 kg (175 lb)   LMP 03/01/2017   SpO2 100%   BMI 29.12 kg/m   Physical Exam  Constitutional: She is oriented to person, place, and time. She appears well-developed and well-nourished. No distress.  HENT:  Head: Normocephalic and atraumatic.  Eyes: Conjunctivae are normal.  Neck: Normal range of motion. Neck supple.  Cardiovascular: Normal rate, regular rhythm and normal heart sounds.  Exam reveals no friction rub.   No murmur heard. Pulmonary/Chest: Effort normal and breath sounds normal. No respiratory distress. She has no wheezes. She has no rales.  Abdominal: Soft. Bowel sounds are normal. She exhibits no distension. There is no tenderness.  Genitourinary:  Genitourinary Comments: Scant amount of white discharge in vaginal vault Chaperone present throughout entire exam. No adnexal fullness or TTP. Os closed. No bleeding.  Musculoskeletal: Normal range of motion. She exhibits no edema.  Neurological: She is alert and oriented to person, place, and time. She  exhibits normal muscle tone.  Skin: Skin is warm. Capillary refill takes less than 2 seconds. No pallor.  Psychiatric: She has a normal mood and affect. Her behavior is normal.  Nursing note and vitals reviewed.  ED Treatments / Results  DIAGNOSTIC STUDIES: Oxygen Saturation is 100% on RA, normal by my interpretation.   COORDINATION OF CARE: 7:47 PM-Discussed next steps with pt. Pt verbalized understanding and is agreeable with the plan.   Labs (all labs ordered are listed, but only abnormal results are displayed) Labs Reviewed  WET PREP, GENITAL - Abnormal; Notable for the following:       Result Value   Clue Cells Wet Prep HPF POC PRESENT (*)    WBC, Wet Prep HPF POC MODERATE (*)    All other components within normal limits  URINALYSIS, ROUTINE W REFLEX MICROSCOPIC - Abnormal; Notable for the following:    APPearance CLOUDY (*)    All other components within normal limits  GC/CHLAMYDIA PROBE AMP ([place]) NOT AT [facility]   EKG  EKG Interpretation None      Radiology [place] Ob Limited > 14 Wks  Result Date: 06/19/2017 CLINICAL DATA:  Pregnant, left lower quadrant pain EXAM: LIMITED OBSTETRIC ULTRASOUND AN DOPPLER FINDINGS: Number of Fetuses: 1 Heart Rate:  153 bpm Movement: Yes Presentation: Variable Placental Location: Posterior/fundal Previa: No Amniotic Fluid (Subjective):  Within normal limits. FL:  1.9cm 15w  4d MATERNAL FINDINGS: Cervix:  Appears closed. Uterus/Adnexae: No abnormality visualized. Left ovary measures 2.6 x 5.1 x 4.5 cm and is notable for a 2.4 cm cyst/follicle. Right ovary measures 2.7 x 4.1 x 3.1 cm and is within normal limits. Pulsed Doppler evaluation of both ovaries demonstrates normal low-resistance arterial and venous waveforms. Other findings No free fluid. IMPRESSION: Single live intrauterine gestation, as described above. Fetal heart rate 153 beats per minute. No evidence of ovarian torsion. This exam is performed on an emergent basis and does not  comprehensively evaluate fetal size, dating, or anatomy; follow-up complete OB US should be considered if further fetal assessment is warranted. Electronically Signed   By: Charline BillsSriyesh  Krishnan M.D.   On: 06/19/2017 21:07   [place] Art/ven Flow Abd Pelv Doppler  Result Date: 06/19/2017 CLINICAL DATA:  Pregnant, left lower quadrant pain EXAM: LIMITED OBSTETRIC ULTRASOUND AN DOPPLER FINDINGS: Number of Fetuses: 1 Heart Rate:  153 bpm Movement: Yes Presentation: Variable Placental Location: Posterior/fundal Previa: No Amniotic Fluid (Subjective):  Within normal limits. FL:  1.9cm 15w  4d MATERNAL FINDINGS: Cervix:  Appears closed. Uterus/Adnexae: No abnormality visualized. Left ovary measures 2.6 x 5.1 x 4.5 cm and is notable for a 2.4 cm cyst/follicle. Right ovary measures 2.7 x 4.1 x 3.1 cm and is within normal limits. Pulsed Doppler evaluation of both ovaries demonstrates normal low-resistance arterial and venous waveforms. Other findings No free fluid. IMPRESSION: Single live intrauterine gestation, as described above. Fetal heart rate 153 beats per minute. No evidence of ovarian torsion. This  exam is performed on an emergent basis and does not comprehensively evaluate fetal size, dating, or anatomy; follow-up complete OB US should be considered if further fetal assessment is warranted. Electronically Signed   By: Charline Bills M.D.   On: 06/19/2017 21:07    Procedures Procedures   Medications Ordered in ED Medications - No data to display  Initial Impression / Assessment and Plan / ED Course  I have reviewed the triage vital signs and the nursing notes.  Pertinent labs & imaging results that were available during my care of the patient were reviewed by me and considered in my medical decision making (see chart for details).     31 yo G1P0 at estimated [redacted]wk GA here with transient, now resolved lower abdominal/pelvic pain. Suspect possible ovarian cyst versus pain from constipation. U/S obtained in ED  and fortunately shows no evidence of torsion. FHR reassuring, no signs of fetal demise or pregnancy complication. She has had no vaginal bleeding, and exam shows no blood in vagina. UA is without signs of UTI. Pelvic exam does show white discharge and wet prep + for BV, though exam is not c/w PID. I do not suspect TOA or torsion based on normal U/S, reassuring history and exam. I discussed findings, diagnosis with patient. Will treat supportively, give flagyl for BV, and advise outpt OB follow-up. Return precautions given.  Final Clinical Impressions(s) / ED Diagnoses   Final diagnoses:  Pelvic pain in female  Bacterial vaginosis   New Prescriptions Discharge Medication List as of 06/19/2017 10:07 PM    START taking these medications   Details  metroNIDAZOLE (FLAGYL) 500 MG tablet Take 1 tablet (500 mg total) by mouth 2 (two) times daily., Starting Fri 06/19/2017, Print       I personally performed the services described in this documentation, which was scribed in my presence. The recorded information has been reviewed and is accurate.     Shaune Pollack, MD 06/20/17 [phone]

## 2017-06-19 NOTE — ED Notes (Signed)
ED Provider at bedside. 

## 2017-06-19 NOTE — ED Triage Notes (Addendum)
C/o left lower pelvic pain that she states feels like "ovary pain" and vaginal d/c-NAD-steady gait-pt is [redacted] weeks pregnant

## 2017-06-22 LAB — GC/CHLAMYDIA PROBE AMP (~~LOC~~) NOT AT ARMC
CHLAMYDIA, DNA PROBE: NEGATIVE
Neisseria Gonorrhea: NEGATIVE

## 2018-08-14 ENCOUNTER — Encounter (HOSPITAL_BASED_OUTPATIENT_CLINIC_OR_DEPARTMENT_OTHER): Payer: Self-pay | Admitting: Emergency Medicine

## 2018-08-14 ENCOUNTER — Emergency Department (HOSPITAL_BASED_OUTPATIENT_CLINIC_OR_DEPARTMENT_OTHER): Payer: Self-pay

## 2018-08-14 ENCOUNTER — Other Ambulatory Visit: Payer: Self-pay

## 2018-08-14 ENCOUNTER — Emergency Department (HOSPITAL_BASED_OUTPATIENT_CLINIC_OR_DEPARTMENT_OTHER)
Admission: EM | Admit: 2018-08-14 | Discharge: 2018-08-14 | Disposition: A | Discharge status: Home / Self Care | Payer: Self-pay | Attending: Emergency Medicine | Admitting: Emergency Medicine

## 2018-08-14 DIAGNOSIS — Z87891 Personal history of nicotine dependence: Secondary | ICD-10-CM | POA: Insufficient documentation

## 2018-08-14 DIAGNOSIS — Z79899 Other long term (current) drug therapy: Secondary | ICD-10-CM | POA: Insufficient documentation

## 2018-08-14 DIAGNOSIS — R1010 Upper abdominal pain, unspecified: Secondary | ICD-10-CM | POA: Insufficient documentation

## 2018-08-14 LAB — BASIC METABOLIC PANEL
Anion gap: 7 (ref 5–15)
BUN: 10 mg/dL (ref 6–20)
CHLORIDE: 108 mmol/L (ref 98–111)
CO2: 28 mmol/L (ref 22–32)
CREATININE: 0.67 mg/dL (ref 0.44–1.00)
Calcium: 9.1 mg/dL (ref 8.9–10.3)
GFR calc Af Amer: >60 mL/min (ref >60)
GFR calc non Af Amer: >60 mL/min (ref >60)
GLUCOSE: 88 mg/dL (ref 70–99)
Potassium: 3.7 mmol/L (ref 3.5–5.1)
SODIUM: 143 mmol/L (ref 135–145)

## 2018-08-14 LAB — CBC WITH DIFFERENTIAL/PLATELET
Basophils Absolute: 0 10*3/uL (ref 0.0–0.1)
Basophils Relative: 0 %
EOS ABS: 0.1 10*3/uL (ref 0.0–0.7)
EOS PCT: 1 %
HCT: 36.9 % (ref 36.0–46.0)
Hemoglobin: 12.4 g/dL (ref 12.0–15.0)
LYMPHS PCT: 50 %
Lymphs Abs: 3.4 10*3/uL (ref 0.7–4.0)
MCH: 29.6 pg (ref 26.0–34.0)
MCHC: 33.6 g/dL (ref 30.0–36.0)
MCV: 88.1 fL (ref 78.0–100.0)
MONO ABS: 0.6 10*3/uL (ref 0.1–1.0)
MONOS PCT: 9 %
Neutro Abs: 2.8 10*3/uL (ref 1.7–7.7)
Neutrophils Relative %: 40 %
PLATELETS: 363 10*3/uL (ref 150–400)
RBC: 4.19 MIL/uL (ref 3.87–5.11)
RDW: 12.4 % (ref 11.5–15.5)
WBC: 6.9 10*3/uL (ref 4.0–10.5)

## 2018-08-14 LAB — PREGNANCY, URINE: PREG TEST UR: NEGATIVE

## 2018-08-14 MED ORDER — IOPAMIDOL (ISOVUE-300) INJECTION 61%
100.0000 mL | Freq: Once | INTRAVENOUS | Status: AC | PRN
  Administered 2018-08-14: 100 mL via INTRAVENOUS

## 2018-08-14 MED ORDER — NAPROXEN 500 MG PO TABS: 500.0000 mg | ORAL_TABLET | Freq: Two times a day (BID) | ORAL | 0 refills | Status: DC

## 2018-08-14 MED ORDER — METHOCARBAMOL 500 MG PO TABS: 500.0000 mg | ORAL_TABLET | Freq: Two times a day (BID) | ORAL | 0 refills | Status: DC
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
## 2019-07-01 ENCOUNTER — Other Ambulatory Visit: Payer: Self-pay

## 2019-07-01 ENCOUNTER — Encounter (HOSPITAL_BASED_OUTPATIENT_CLINIC_OR_DEPARTMENT_OTHER): Payer: Self-pay | Admitting: Emergency Medicine

## 2019-07-01 ENCOUNTER — Emergency Department (HOSPITAL_BASED_OUTPATIENT_CLINIC_OR_DEPARTMENT_OTHER)
Admission: EM | Admit: 2019-07-01 | Discharge: 2019-07-01 | Disposition: A | Discharge status: Home / Self Care | Payer: Medicaid Other | Attending: Emergency Medicine | Admitting: Emergency Medicine

## 2019-07-01 DIAGNOSIS — R1030 Lower abdominal pain, unspecified: Secondary | ICD-10-CM

## 2019-07-01 DIAGNOSIS — Z87891 Personal history of nicotine dependence: Secondary | ICD-10-CM | POA: Insufficient documentation

## 2019-07-01 LAB — URINALYSIS, MICROSCOPIC (REFLEX): RBC / HPF: >50 RBC/hpf (ref 0–5)

## 2019-07-01 LAB — URINALYSIS, ROUTINE W REFLEX MICROSCOPIC
Glucose, UA: NEGATIVE mg/dL
Ketones, ur: NEGATIVE mg/dL
Leukocytes,Ua: NEGATIVE
Nitrite: NEGATIVE
Protein, ur: 30 mg/dL — AB
Specific Gravity, Urine: >1.03 — ABNORMAL HIGH (ref 1.005–1.030)
pH: 5.5 (ref 5.0–8.0)

## 2019-07-01 LAB — PREGNANCY, URINE: Preg Test, Ur: NEGATIVE

## 2019-07-01 NOTE — ED Provider Notes (Signed)
Emergency Department Provider Note   I have reviewed the triage vital signs and the nursing notes.   HISTORY  Chief Complaint Pelvic Pain   HPI Anita Gomez is a 33 y.o. female with PMH of ovarian cyst and s/p C-section in 11/2018 with OB at [facility] presents to the emergency department for evaluation of intermittent pain over her C-section scar area.  She reports pain which is sharp and lasting for several hours which feels directly over her C-section scar.  This occurs at random intervals and not daily.  It has been occurring since her surgery in January.  She denies any complications from her surgery or healing.  She has not had fevers.  She does have heavy menstrual cycles and started today but states that is normal for her.  She is not experiencing any vaginal discharge or vaginal pain.  Pain is directly over her scar and not unilateral.  She denies any flank discomfort.  No upper abdominal pain.  No clear modifying factors or radiation of symptoms.  Past Medical History:  Diagnosis Date  . Back pain   . Ovarian cyst     There are no active problems to display for this patient.   Past Surgical History:  Procedure Laterality Date  . CESAREAN SECTION      Allergies Iodine and Shellfish allergy  No family history on file.  Social History Social History   Tobacco Use  . Smoking status: Former Smoker    Packs/day: 0.10  . Smokeless tobacco: Never Used  Substance Use Topics  . Alcohol use: No    Comment: occ  . Drug use: No    Review of Systems  Constitutional: No fever/chills Eyes: No visual changes. ENT: No sore throat. Cardiovascular: Denies chest pain. Respiratory: Denies shortness of breath. Gastrointestinal: Positive lower abdominal pain (intermittent).  No nausea, no vomiting.  No diarrhea.  No constipation. Genitourinary: Negative for dysuria. Musculoskeletal: Negative for back pain. Skin: Negative for rash. Neurological: Negative  for headaches, focal weakness or numbness.  10-point ROS otherwise negative.  ____________________________________________   PHYSICAL EXAM:  VITAL SIGNS: ED Triage Vitals  Enc Vitals Group     BP 07/01/19 0809 138/64     Pulse Rate 07/01/19 0809 69     Resp 07/01/19 0809 18     Temp 07/01/19 0809 97.6 F (36.4 C)     Temp Source 07/01/19 0809 Oral     SpO2 07/01/19 0809 100 %     Weight 07/01/19 0810 180 lb (81.6 kg)     Height 07/01/19 0810 5\' 5"  (1.651 m)   Constitutional: Alert and oriented. Well appearing and in no acute distress. Eyes: Conjunctivae are normal. Head: Atraumatic. Nose: No congestion/rhinnorhea. Mouth/Throat: Mucous membranes are moist.  Neck: No stridor.  Cardiovascular: Normal rate, regular rhythm. Good peripheral circulation. Grossly normal heart sounds.   Respiratory: Normal respiratory effort.  No retractions. Lungs CTAB. Gastrointestinal: Soft and nontender. No distention.  Well appearing lower cesarean scar without focal tenderness, erythema, warmth. Musculoskeletal: No lower extremity tenderness nor edema. No gross deformities of extremities. Neurologic:  Normal speech and language. No gross focal neurologic deficits are appreciated.  Skin:  Skin is warm, dry and intact. No rash noted.  ____________________________________________   LABS (all labs ordered are listed, but only abnormal results are displayed)  Labs Reviewed  URINALYSIS, ROUTINE W REFLEX MICROSCOPIC - Abnormal; Notable for the following components:      Result Value   Color, Urine BROWN (*)  APPearance CLOUDY (*)    Specific Gravity, Urine >1.030 (*)    Hgb urine dipstick LARGE (*)    Bilirubin Urine SMALL (*)    Protein, ur 30 (*)    All other components within normal limits  URINALYSIS, MICROSCOPIC (REFLEX) - Abnormal; Notable for the following components:   Bacteria, UA MANY (*)    All other components within normal limits  PREGNANCY, URINE    ____________________________________________   PROCEDURES  Procedure(s) performed:   Procedures  None  ____________________________________________   INITIAL IMPRESSION / ASSESSMENT AND PLAN / ED COURSE  Pertinent labs & imaging results that were available during my care of the patient were reviewed by me and considered in my medical decision making (see chart for details).   Patient presents to the emergency department for evaluation of intermittent pain over her C-section scar.  She is not experiencing any vaginal symptoms at this time such as discharge or pain.  Pain is not unilateral or radiating to the flank to make me think of ovarian cyst, torsion, kidney stone.  Suspect pain related to development of scar tissue during the procedure.  I discussed with her that she may need to return to her OB to discuss pelvic floor physical therapy type referral.  Do not feel the pelvic exam would be beneficial at this time given her lack of symptoms in that area.  Will run UA to assess for possible infection and/or pregnancy but low suspicion for either.   UA and pregnancy are unremarkable.  Plan for OB follow-up. Discussed ED return precautions.  ____________________________________________  FINAL CLINICAL IMPRESSION(S) / ED DIAGNOSES  Final diagnoses:  Lower abdominal pain    Note:  This document was prepared using Dragon voice recognition software and may include unintentional dictation errors.  Alona BeneJoshua Kyair Ditommaso, MD Emergency Medicine    Shloima Clinch, Arlyss RepressJoshua G, MD 07/01/19 [phone]

## 2019-07-01 NOTE — ED Triage Notes (Signed)
Pelvic/abdomen pulling sensation since January.  Pt states it happens at random, pulling sensation.  Pt states menstrual flow has been much heavier.  No other discharge.  Pt had c-section in January.

## 2019-07-01 NOTE — Discharge Instructions (Signed)
You were seen in the emergency department today with lower abdominal pain.  I suspect this is related to scar tissue in the area after C-section, which is a normal development.  Take Tylenol and or Motrin as needed for pain along with applying a warm compress.  Please call either your OB or the one listed to schedule a follow-up appointment as you might benefit from pelvic physical therapy.  Return to the emergency department with any worsening/severe symptoms.

## 2021-10-10 ENCOUNTER — Emergency Department (HOSPITAL_BASED_OUTPATIENT_CLINIC_OR_DEPARTMENT_OTHER)
Admission: EM | Admit: 2021-10-10 | Discharge: 2021-10-10 | Disposition: A | Discharge status: Home / Self Care | Payer: Self-pay | Attending: Emergency Medicine | Admitting: Emergency Medicine

## 2021-10-10 ENCOUNTER — Encounter (HOSPITAL_BASED_OUTPATIENT_CLINIC_OR_DEPARTMENT_OTHER): Payer: Self-pay | Admitting: *Deleted

## 2021-10-10 ENCOUNTER — Other Ambulatory Visit: Payer: Self-pay

## 2021-10-10 ENCOUNTER — Emergency Department (HOSPITAL_BASED_OUTPATIENT_CLINIC_OR_DEPARTMENT_OTHER): Payer: Self-pay

## 2021-10-10 DIAGNOSIS — F17210 Nicotine dependence, cigarettes, uncomplicated: Secondary | ICD-10-CM | POA: Insufficient documentation

## 2021-10-10 DIAGNOSIS — M25521 Pain in right elbow: Secondary | ICD-10-CM

## 2021-10-10 DIAGNOSIS — Y990 Civilian activity done for income or pay: Secondary | ICD-10-CM | POA: Insufficient documentation

## 2021-10-10 DIAGNOSIS — M7031 Other bursitis of elbow, right elbow: Secondary | ICD-10-CM | POA: Insufficient documentation

## 2021-10-10 DIAGNOSIS — X500XXA Overexertion from strenuous movement or load, initial encounter: Secondary | ICD-10-CM | POA: Insufficient documentation

## 2021-10-10 DIAGNOSIS — N9489 Other specified conditions associated with female genital organs and menstrual cycle: Secondary | ICD-10-CM | POA: Insufficient documentation

## 2021-10-10 DIAGNOSIS — Y93F2 Activity, caregiving, lifting: Secondary | ICD-10-CM | POA: Insufficient documentation

## 2021-10-10 LAB — CBC WITH DIFFERENTIAL/PLATELET
Abs Immature Granulocytes: 0.02 10*3/uL (ref 0.00–0.07)
Basophils Absolute: 0 10*3/uL (ref 0.0–0.1)
Basophils Relative: 0 %
Eosinophils Absolute: 0.1 10*3/uL (ref 0.0–0.5)
Eosinophils Relative: 1 %
HCT: 37.2 % (ref 36.0–46.0)
Hemoglobin: 12.2 g/dL (ref 12.0–15.0)
Immature Granulocytes: 0 %
Lymphocytes Relative: 36 %
Lymphs Abs: 2.3 10*3/uL (ref 0.7–4.0)
MCH: 28.9 pg (ref 26.0–34.0)
MCHC: 32.8 g/dL (ref 30.0–36.0)
MCV: 88.2 fL (ref 80.0–100.0)
Monocytes Absolute: 0.5 10*3/uL (ref 0.1–1.0)
Monocytes Relative: 8 %
Neutro Abs: 3.4 10*3/uL (ref 1.7–7.7)
Neutrophils Relative %: 55 %
Platelets: 364 10*3/uL (ref 150–400)
RBC: 4.22 MIL/uL (ref 3.87–5.11)
RDW: 13.2 % (ref 11.5–15.5)
WBC: 6.2 10*3/uL (ref 4.0–10.5)
nRBC: 0 % (ref 0.0–0.2)

## 2021-10-10 LAB — BASIC METABOLIC PANEL
Anion gap: 8 (ref 5–15)
BUN: 10 mg/dL (ref 6–20)
CO2: 23 mmol/L (ref 22–32)
Calcium: 8.6 mg/dL — ABNORMAL LOW (ref 8.9–10.3)
Chloride: 104 mmol/L (ref 98–111)
Creatinine, Ser: 0.7 mg/dL (ref 0.44–1.00)
GFR, Estimated: >60 mL/min (ref >60)
Glucose, Bld: 87 mg/dL (ref 70–99)
Potassium: 3.6 mmol/L (ref 3.5–5.1)
Sodium: 135 mmol/L (ref 135–145)

## 2021-10-10 LAB — SEDIMENTATION RATE: Sed Rate: 10 mm/hr (ref 0–22)

## 2021-10-10 LAB — HCG, SERUM, QUALITATIVE: Preg, Serum: NEGATIVE

## 2021-10-10 MED ORDER — ACETAMINOPHEN 325 MG PO TABS
650.0000 mg | ORAL_TABLET | Freq: Once | ORAL | Status: AC
  Administered 2021-10-10: 650 mg via ORAL
  Filled 2021-10-10: qty 2

## 2021-10-10 MED ORDER — IBUPROFEN 600 MG PO TABS: 600.0000 mg | ORAL_TABLET | Freq: Four times a day (QID) | ORAL | 0 refills | Status: AC | PRN

## 2021-10-10 NOTE — ED Provider Notes (Signed)
Patient signed out to me by previous provider. Please refer to their note for full HPI.  Briefly this is a 35 year old female who presented to the emergency department atraumatic right elbow pain.  Patient was afebrile, x-ray reassuring, mild amount of swelling posteriorly.  Blood work reassuring, we are pending inflammatory markers.  Low suspicion for septic arthritis at this time.  Appears to most likely be a bursitis. Physical Exam  BP 115/80   Pulse (!) 50   Temp 98 F (36.7 C) (Oral)   Resp 14   Ht _0  (1.651 m)   Wt 81.6 kg   LMP 10/06/2021   SpO2 100%   BMI 29.95 kg/m   Physical Exam Vitals and nursing note reviewed.  Constitutional:      Appearance: Normal appearance.  HENT:     Head: Normocephalic.     Mouth/Throat:     Mouth: Mucous membranes are moist.  Cardiovascular:     Rate and Rhythm: Normal rate.  Pulmonary:     Effort: Pulmonary effort is normal. No respiratory distress.  Abdominal:     Palpations: Abdomen is soft.     Tenderness: There is no abdominal tenderness.  Musculoskeletal:     Comments: Mild posterior elbow swelling, no erythema or warmth, arm is vascularly intact  Skin:    General: Skin is warm.  Neurological:     Mental Status: She is alert and oriented to person, place, and time. Mental status is at baseline.  Psychiatric:        Mood and Affect: Mood normal.    ED Course/Procedures     Procedures  MDM   ESR is negative.  Initially we were going to wait for CRP but I confirmed with lab that this is actually a send out to [facility] and would not be expected back anytime soon.  I am reassured by the patient's physical exam and have the same impression is a previous physician.  Plan to treat conservatively with outpatient follow-up.  Patient at this time appears safe and stable for discharge and will be treated as an outpatient.  Discharge plan and strict return to ED precautions discussed, patient verbalizes understanding and  agreement.       Lorelle Gibbs, DO 10/10/21 1743

## 2021-10-10 NOTE — ED Triage Notes (Signed)
Right elbow pain since last night. No injury. Swelling noted. Painful with movement.

## 2021-10-10 NOTE — ED Provider Notes (Signed)
[facility] EMERGENCY DEPARTMENT Provider Note   CSN: [id number] Arrival date & time: 10/10/21  1123     History Chief Complaint  Patient presents with   Joint Swelling    Anita Gomez is a 35 y.o. female.  This is a 35 y.o. 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  No recent injuries.  No repetitive arm movements that she can really identify.  Noticed pain to her right elbow starting last night, worsening swelling since onset of pain, diff  moving her arm secondary to the discomfort.   The history is provided by the patient. No language interpreter was used.      Past Medical History:  Diagnosis Date   Back pain    Ovarian cyst     There are no problems to display for this patient.   Past Surgical History:  Procedure Laterality Date   CESAREAN SECTION       OB History     Gravida  1   Para      Term      Preterm      AB      Living         SAB      IAB      Ectopic      Multiple      Live Births              No family history on file.  Social History   Tobacco Use   Smoking status: Every Day    Packs/day: 0.10    Types: Cigarettes   Smokeless tobacco: Never  Substance Use Topics   Alcohol use: No    Comment: occ   Drug use: No    Home Medications Prior to Admission medications   Medication Sig Start Date End Date Taking? Authorizing Provider  ibuprofen  (ADVIL) 600 MG tablet Take 1 tablet (600 mg total) by mouth every 6 (six) hours as needed. 10/10/21  Yes Tanda Rockers A, DO    Allergies    Iodine and Shellfish allergy  Review of Systems   Review of Systems  Constitutional:  Negative for chills and fever.  HENT:  Negative for facial swelling and trouble swallowing.   Eyes:  Negative for photophobia and visual disturbance.  Respiratory:  Negative for cough and shortness of breath.   Cardiovascular:  Negative for chest pain and palpitations.  Gastrointestinal:  Negative for abdominal pain, nausea and vomiting.  Endocrine: Negative for polydipsia and polyuria.  Genitourinary:  Negative for difficulty urinating and hematuria.  Musculoskeletal:  Positive for arthralgias and joint swelling. Negative for gait problem.  Skin:  Negative for pallor and rash.  Neurological:  Negative for syncope and headaches.  Psychiatric/Behavioral:  Negative for agitation and confusion.    Physical Exam Updated Vital Signs BP 115/80   Pulse (!) 50   Temp 98 F (36.7 C) (Oral)   Resp 14   Ht 5\' 5"  (1.651 m)   Wt 81.6 kg   LMP 10/06/2021   SpO2  100%   BMI 29.95 kg/m   Physical Exam Vitals and nursing note reviewed.  Constitutional:      General: She is not in acute distress.    Appearance: Normal appearance.  HENT:     Head: Normocephalic and atraumatic.     Right Ear: External ear normal.     Left Ear: External ear normal.     Nose: Nose normal.     Mouth/Throat:     Mouth: Mucous membranes are moist.  Eyes:     General: No scleral icterus.       Right eye: No discharge.        Left eye: No discharge.  Cardiovascular:     Rate and Rhythm: Normal rate and regular rhythm.     Pulses: Normal pulses.     Heart sounds: Normal heart sounds.  Pulmonary:     Effort: Pulmonary effort is normal. No respiratory distress.     Breath sounds: Normal breath sounds.  Abdominal:     General: Abdomen is flat.     Tenderness: There is no abdominal  tenderness.  Musculoskeletal:        General: Normal range of motion.       Arms:     Cervical back: Normal range of motion.     Right lower leg: No edema.     Left lower leg: No edema.     Comments: Bilateral upper extremities are neurovascular intact bilateral to radial, ulnar and median nerve distributions, 2+ radial pulses symmetric bilateral.  No overlying cellulitic changes to right elbow.  There is significant pain with passive range of motion to right elbow on flexion and extension.  No pain to ipsilateral wrist or shoulder  Skin:    General: Skin is warm and dry.     Capillary Refill: Capillary refill takes less than 2 seconds.  Neurological:     Mental Status: She is alert.  Psychiatric:        Mood and Affect: Mood normal.        Behavior: Behavior normal.    ED Results / Procedures / Treatments   Labs (all labs ordered are listed, but only abnormal results are displayed) Labs Reviewed  BASIC METABOLIC PANEL - Abnormal; Notable for the following components:      Result Value   Calcium 8.6 (*)    All other components within normal limits  CULTURE, BLOOD (ROUTINE X 2)  CBC WITH DIFFERENTIAL/PLATELET  SEDIMENTATION RATE  HCG, SERUM, QUALITATIVE  C-REACTIVE PROTEIN    EKG None  Radiology DG Elbow Complete Right  Result Date: 10/10/2021 CLINICAL DATA:  Right elbow pain and swelling.  No injury. EXAM: RIGHT ELBOW - COMPLETE 3+ VIEW COMPARISON:  None. FINDINGS: No acute fracture or dislocation. Moderate joint effusion with elevation of the posterior fat pad. Joint spaces are preserved. No bony erosions or periosteal reaction. Bone mineralization is normal. Soft tissues are unremarkable. IMPRESSION: 1. Moderate joint effusion without acute osseous abnormality. Electronically Signed   By: Titus Dubin M.D.   On: 10/10/2021 12:07    Procedures Procedures   Medications Ordered in ED Medications  acetaminophen (TYLENOL) tablet 650 mg (650 mg Oral Given 10/10/21 1333)     ED Course  I have reviewed the triage vital signs and the nursing notes.  Pertinent labs & imaging results that were available during my care of the patient were reviewed by me and considered in my medical decision making (see chart for details).    MDM  Rules/Calculators/A&P                           CC: Elbow swelling  This patient complains of above; this involves an extensive number of treatment options and is a complaint that carries with it a high risk of complications and morbidity. Vital signs were reviewed. Serious etiologies considered.  Record review:   Previous records obtained and reviewed   Work up as above, notable for:  Labs & imaging results that were available during my care of the patient were reviewed by me and considered in my medical decision making.   I ordered imaging studies which included right elbow x-ray and I independently visualized and interpreted imaging which showed moderate joint effusion, no fracture or bony erosion  Management: Patient with reduced range of motion to right upper extremity, swelling noted on physical exam and on x-ray there is an effusion.  She has no fever, no IV drug use, no overlying cellulitis.  There is no significant warmth to the affected area.  Will obtain blood work and blood cultures given the lack of traumatic injury and pain with passive range of motion septic joint is possible although less likely.  Initial labs returned and are stable. My suspicion for septic bursitis/joint remains low. Favor bursitis. Final lab are pending and pt signed out to incoming EDP at this time. Plan to likely d/c with supportive care, nsaids, ace wrap and close pcp f/u if labs negative.            This chart was dictated using voice recognition software.  Despite best efforts to proofread,  errors can occur which can change the documentation meaning.  Final Clinical Impression(s) / ED Diagnoses Final diagnoses:  Right elbow pain   Bursitis of right elbow, unspecified bursa    Rx / DC Orders ED Discharge Orders          Ordered    ibuprofen (ADVIL) 600 MG tablet  Every 6 hours PRN        10/10/21 1507             Jeanell Sparrow, DO 10/10/21 1738

## 2021-10-11 LAB — C-REACTIVE PROTEIN: CRP: 0.7 mg/dL (ref <1.0)

## 2021-10-15 LAB — CULTURE, BLOOD (ROUTINE X 2)
Culture: NO GROWTH
Special Requests: ADEQUATE

## 2022-03-31 ENCOUNTER — Other Ambulatory Visit: Payer: Self-pay

## 2022-03-31 ENCOUNTER — Emergency Department (HOSPITAL_BASED_OUTPATIENT_CLINIC_OR_DEPARTMENT_OTHER)
Admission: EM | Admit: 2022-03-31 | Discharge: 2022-03-31 | Disposition: A | Discharge status: Home / Self Care | Payer: Medicaid Other | Attending: Emergency Medicine | Admitting: Emergency Medicine

## 2022-03-31 ENCOUNTER — Encounter (HOSPITAL_BASED_OUTPATIENT_CLINIC_OR_DEPARTMENT_OTHER): Payer: Self-pay | Admitting: Emergency Medicine

## 2022-03-31 DIAGNOSIS — J029 Acute pharyngitis, unspecified: Secondary | ICD-10-CM | POA: Insufficient documentation

## 2022-03-31 LAB — GROUP A STREP BY PCR: Group A Strep by PCR: NOT DETECTED

## 2022-03-31 MED ORDER — IBUPROFEN 800 MG PO TABS
800.0000 mg | ORAL_TABLET | Freq: Once | ORAL | Status: AC
  Administered 2022-03-31: 800 mg via ORAL
  Filled 2022-03-31: qty 1

## 2022-03-31 NOTE — ED Triage Notes (Signed)
Patient arrived via POV c/o sore throat x 3 days. Patient denies other S/S. Patient denies fever. Patient states taking OTC Nyquil. Patient is AO x 4, VS WDL, normal gait. ?

## 2022-03-31 NOTE — ED Provider Notes (Signed)
?[facility] EMERGENCY DEPARTMENT ?Provider Note ? ? ?CSN: [id number] ?Arrival date & time: 03/31/22  2021 ? ?  ? ?History ? ?Chief Complaint  ?Patient presents with  ? Sore Throat  ? ? ?Anita Gomez is a 36 y.o. female. ? ? ?Sore Throat ? ?Patient with a sore throat for the past 3 days no fevers no nausea or vomiting.  Does endorse some congestion and sinus pressure.  States her sore throat is worse in the mornings and evenings.  No change in phonation. ?  ? ?Home Medications ?Prior to Admission medications   ?Medication Sig Start Date End Date Taking? Authorizing Provider  ?ibuprofen (ADVIL) 600 MG tablet Take 1 tablet (600 mg total) by mouth every 6 (six) hours as needed. 10/10/21   Sloan Leiter, DO  ?   ? ?Allergies    ?Iodine and Shellfish allergy   ? ?Review of Systems   ?Review of Systems ? ?Physical Exam ?Updated Vital Signs ?BP (!) 154/82 (BP Location: Right Arm)   Pulse 67   Temp 98.6 ?F (37 ?C) (Oral)   Resp 18   Ht 5\' 5"  (1.651 m)   Wt 86.2 kg   LMP 03/19/2022 (Approximate)   SpO2 100%   BMI 31.62 kg/m?  ?Physical Exam ?Vitals and nursing note reviewed.  ?Constitutional:   ?   General: She is not in acute distress. ?   Appearance: Normal appearance. She is not ill-appearing.  ?HENT:  ?   Head: Normocephalic and atraumatic.  ?   Nose: Congestion present.  ?   Mouth/Throat:  ?   Mouth: Mucous membranes are moist.  ?   Comments: Clear PND and posterior pharynx.  Diffuse erythematous posterior pharynx.  Uvula midline no tonsillar hypertrophy or exudates ?Eyes:  ?   General: No scleral icterus.    ?   Right eye: No discharge.     ?   Left eye: No discharge.  ?   Conjunctiva/sclera: Conjunctivae normal.  ?Pulmonary:  ?   Effort: Pulmonary effort is normal.  ?   Breath sounds: No stridor.  ?Neurological:  ?   Mental Status: She is alert and oriented to person, place, and time. Mental status is at baseline.  ? ? ?ED Results / Procedures / Treatments   ?Labs ?(all labs ordered are listed,  but only abnormal results are displayed) ?Labs Reviewed  ?GROUP A STREP BY PCR  ? ? ?EKG ?None ? ?Radiology ?No results found. ? ?Procedures ?Procedures  ? ? ?Medications Ordered in ED ?Medications  ?ibuprofen (ADVIL) tablet 800 mg (has no administration in time range)  ? ? ?ED Course/ Medical Decision Making/ A&P ?Clinical Course as of 03/31/22 2257  ?Mon Mar 31, 2022  ?2250 4 days ST.  [WF]  ?  ?Clinical Course User Index ?[WF] 2251, Gailen Shelter  ? ?                        ?Medical Decision Making ?Risk ?Prescription drug management. ? ? ? ?Patient with a sore throat for the past 3 days no fevers no nausea or vomiting.  Does endorse some congestion and sinus pressure.  States her sore throat is worse in the mornings and evenings.  No change in phonation. ? ? ?Negative strep test, I suspect her symptoms are secondary to congestion and PND. ? ?Ibuprofen given in the ER ? ?Return precautions discussed with follow-up with PCP ? ?Final Clinical Impression(s) / ED Diagnoses ?Final  diagnoses:  ?Viral pharyngitis  ? ? ?Rx / DC Orders ?ED Discharge Orders   ? ? None  ? ?  ? ? ?  ?Gailen Shelter, [place] ?03/31/22 2257 ? ?  ?Milagros Loll, MD ?03/31/22 2327 ? ?

## 2022-03-31 NOTE — Discharge Instructions (Addendum)
Viral Illness ?TREATMENT  ?Specifically for sore throat I recommend gargling warm salt water, ibuprofen and Tylenol as discussed below and throat lozenges ? ?Please use Tylenol or ibuprofen for pain.  You may use 600 mg ibuprofen every 6 hours or 1000 mg of Tylenol every 6 hours.  You may choose to alternate between the 2.  This would be most effective.  Not to exceed 4 g of Tylenol within 24 hours.  Not to exceed 3200 mg ibuprofen 24 hours.  ? ?Treatment is directed at relieving symptoms. There is no cure. Antibiotics are not effective, because the infection is caused by a virus, not by bacteria. Treatment may include:  ?Increased fluid intake. Sports drinks offer valuable electrolytes, sugars, and fluids.  ?Breathing heated mist or steam (vaporizer or shower).  ?Eating chicken soup or other clear broths, and maintaining good nutrition.  ?Getting plenty of rest.  ?Using gargles or lozenges for comfort.  ?Increasing usage of your inhaler if you have asthma.  ?Return to work when your temperature has returned to normal.  ?Gargle warm salt water and spit it out for sore throat. Take benadryl to decrease sinus secretions. Continue to alternate between Tylenol and ibuprofen for pain and fever control. ? ?Follow Up: Follow up with your primary care doctor in 5-7 days for recheck of ongoing symptoms.  Return to emergency department for emergent changing or worsening of symptoms. ? ?

## 2022-05-11 IMAGING — DX DG ELBOW COMPLETE 3+V*R*
4 series · 4 of 4 positions shown · non-contrast
Comparison: None.

CLINICAL DATA: Right elbow pain and swelling.  No injury.

EXAM:
RIGHT ELBOW - COMPLETE 3+ VIEW

[elbow ap]
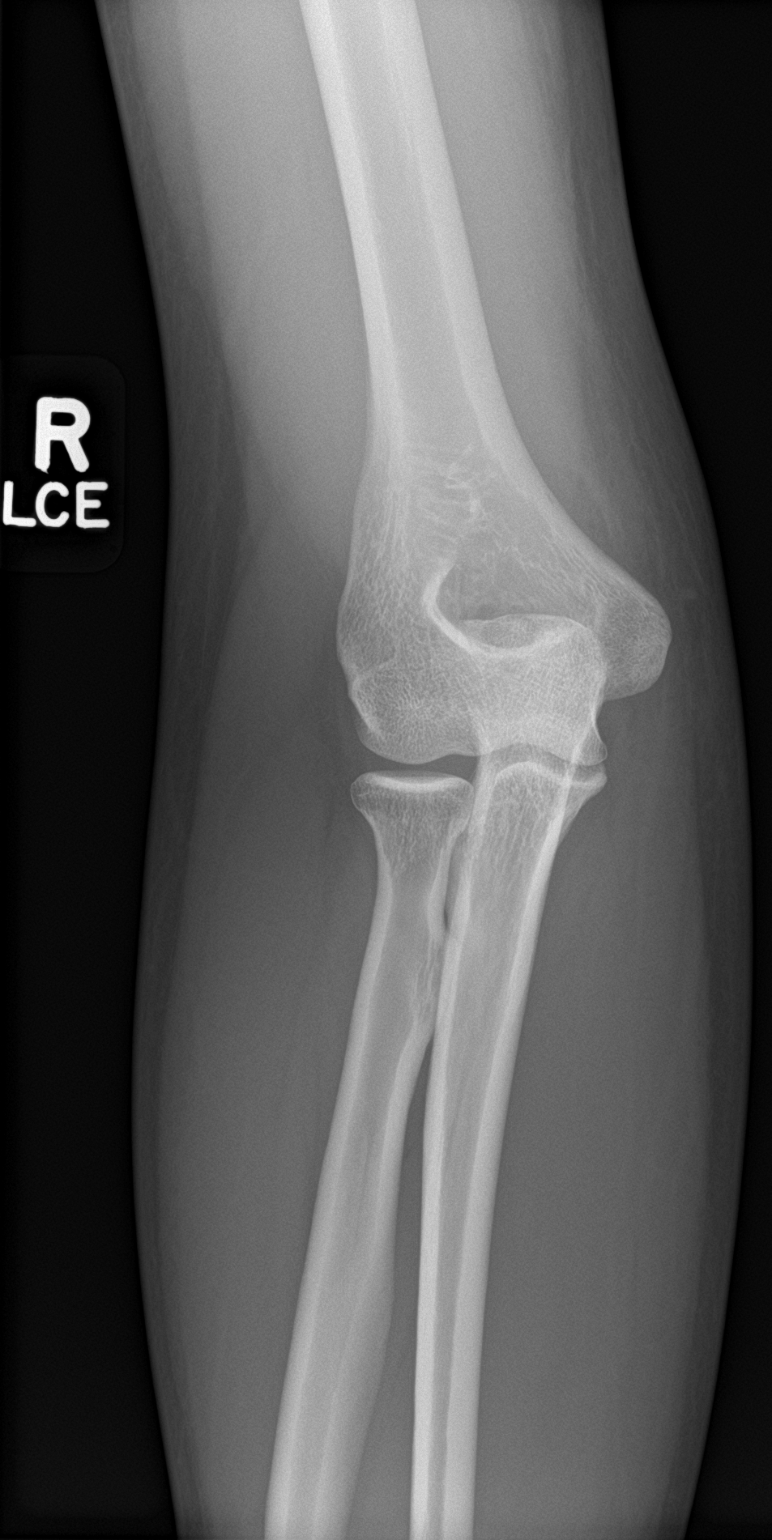

[elbow obl (1 of 2)]
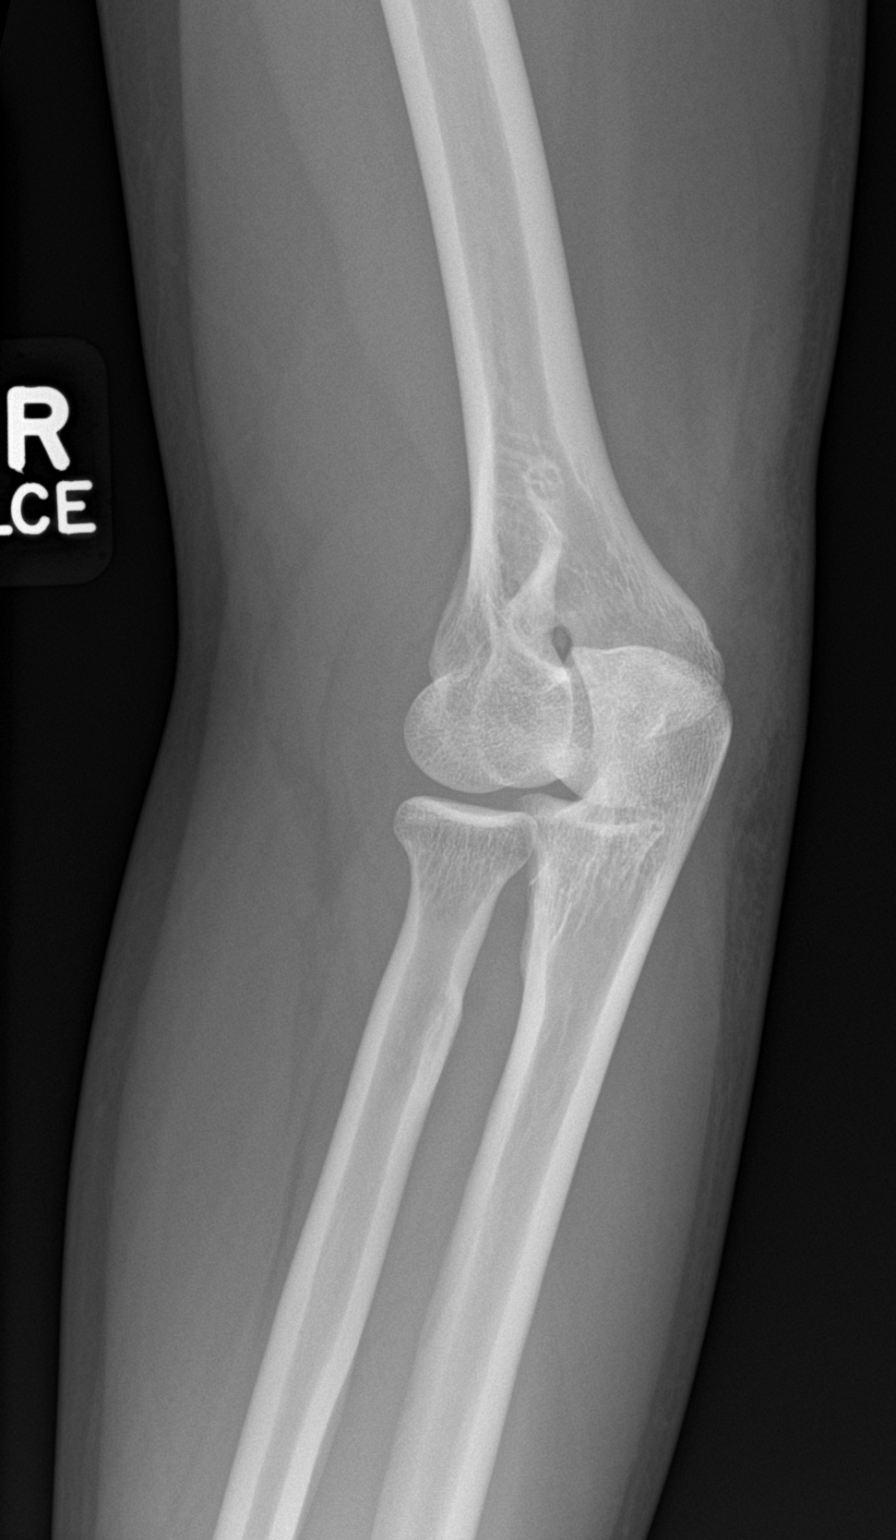

[elbow obl (2 of 2)]
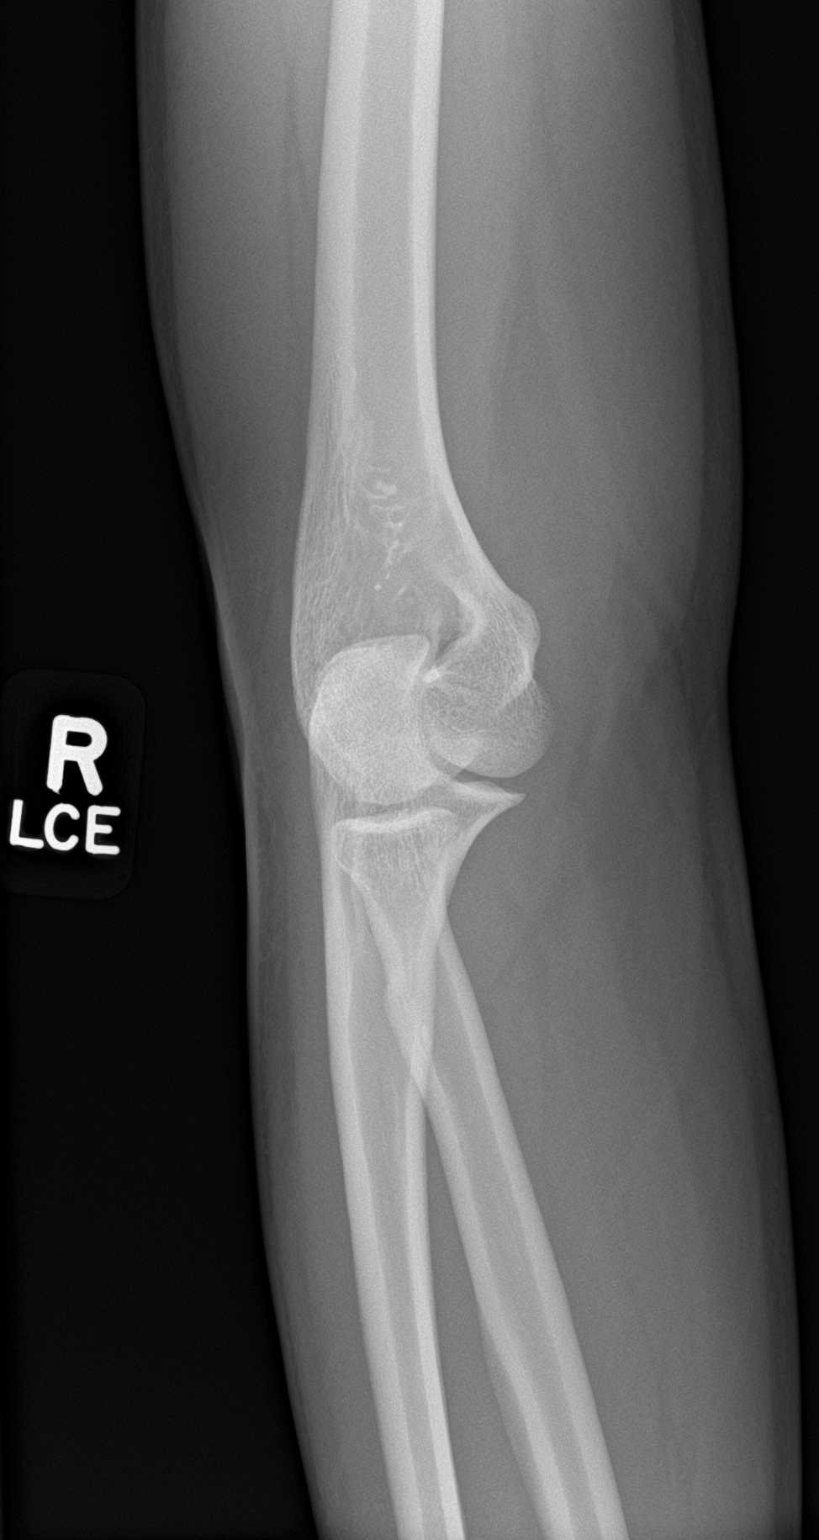

[elbow lat]
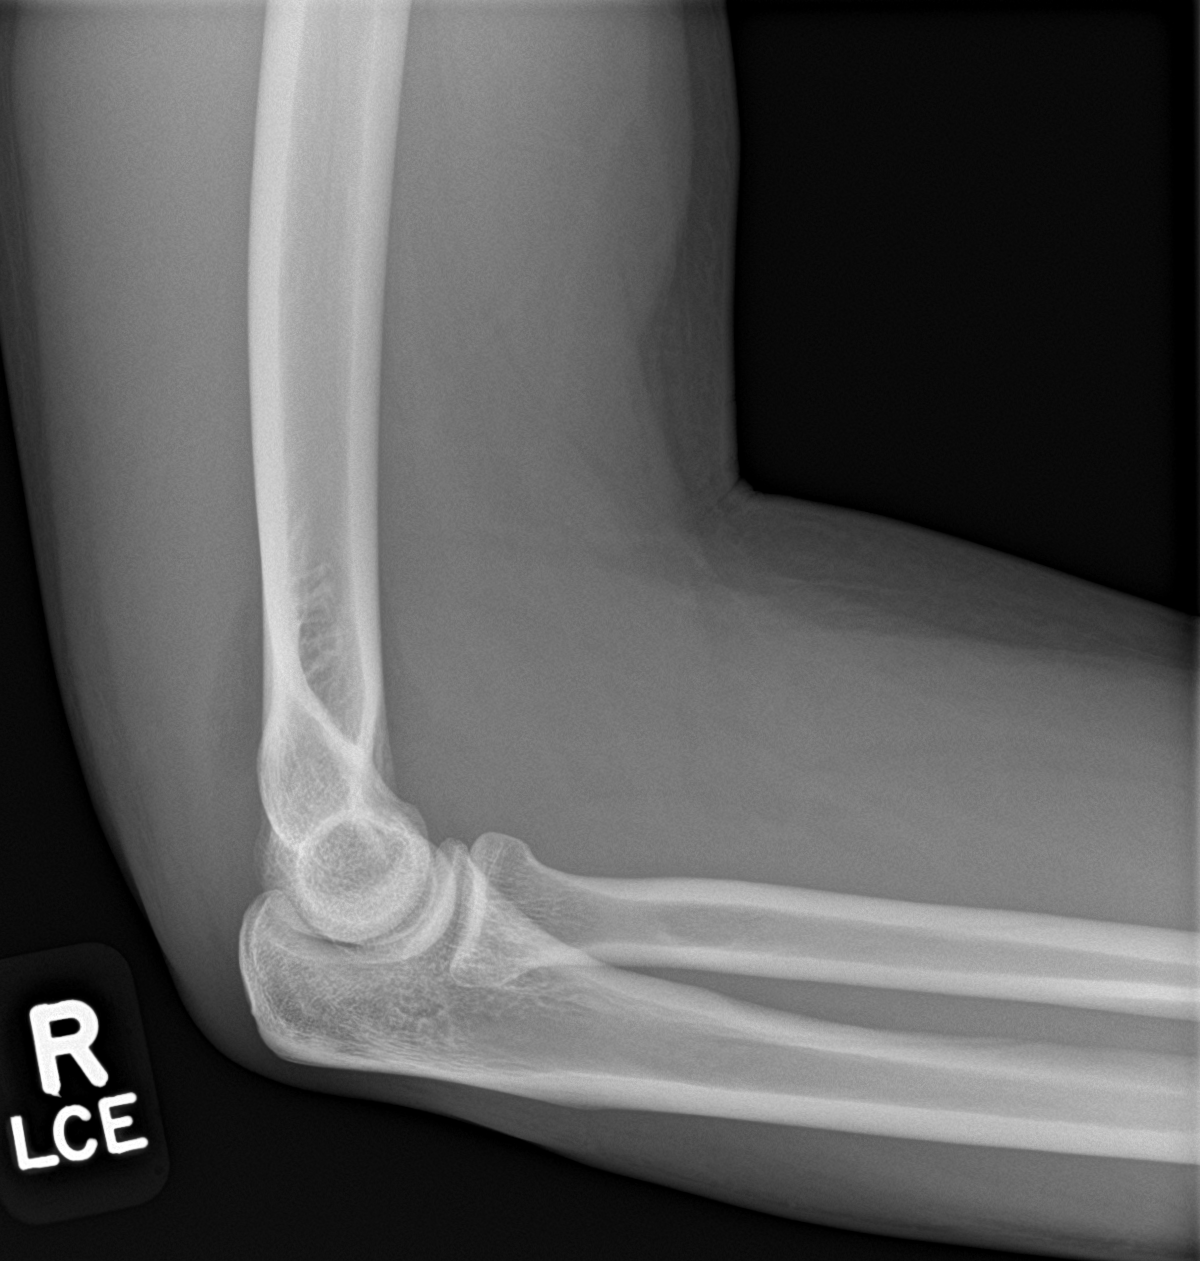

[4 of 4 positions shown; findings below may reference images not displayed]

FINDINGS: No acute fracture or dislocation. Moderate joint effusion with
elevation of the posterior fat pad. Joint spaces are preserved. No
bony erosions or periosteal reaction. Bone mineralization is normal.
Soft tissues are unremarkable.
IMPRESSION: 1. Moderate joint effusion without acute osseous abnormality.

## 2023-02-03 ENCOUNTER — Emergency Department (HOSPITAL_BASED_OUTPATIENT_CLINIC_OR_DEPARTMENT_OTHER)
Admission: EM | Admit: 2023-02-03 | Discharge: 2023-02-03 | Disposition: A | Discharge status: Home / Self Care | Payer: Medicaid Other | Attending: Emergency Medicine | Admitting: Emergency Medicine

## 2023-02-03 ENCOUNTER — Other Ambulatory Visit: Payer: Self-pay

## 2023-02-03 ENCOUNTER — Encounter (HOSPITAL_BASED_OUTPATIENT_CLINIC_OR_DEPARTMENT_OTHER): Payer: Self-pay | Admitting: Emergency Medicine

## 2023-02-03 DIAGNOSIS — B3731 Acute candidiasis of vulva and vagina: Secondary | ICD-10-CM

## 2023-02-03 DIAGNOSIS — N390 Urinary tract infection, site not specified: Secondary | ICD-10-CM

## 2023-02-03 DIAGNOSIS — D72829 Elevated white blood cell count, unspecified: Secondary | ICD-10-CM | POA: Insufficient documentation

## 2023-02-03 DIAGNOSIS — N760 Acute vaginitis: Secondary | ICD-10-CM | POA: Insufficient documentation

## 2023-02-03 LAB — URINALYSIS, ROUTINE W REFLEX MICROSCOPIC
Bilirubin Urine: NEGATIVE
Glucose, UA: NEGATIVE mg/dL
Ketones, ur: NEGATIVE mg/dL
Nitrite: NEGATIVE
Protein, ur: 30 mg/dL — AB
Specific Gravity, Urine: >=1.03 (ref 1.005–1.030)
pH: 5.5 (ref 5.0–8.0)

## 2023-02-03 LAB — WET PREP, GENITAL
Clue Cells Wet Prep HPF POC: NONE SEEN
Sperm: NONE SEEN
Trich, Wet Prep: NONE SEEN
WBC, Wet Prep HPF POC: >=10 — AB (ref <10)

## 2023-02-03 LAB — URINALYSIS, MICROSCOPIC (REFLEX)

## 2023-02-03 LAB — PREGNANCY, URINE: Preg Test, Ur: NEGATIVE

## 2023-02-03 MED ORDER — CEPHALEXIN 500 MG PO CAPS: 500.0000 mg | ORAL_CAPSULE | Freq: Three times a day (TID) | ORAL | 0 refills | Status: AC

## 2023-02-03 MED ORDER — FLUCONAZOLE 150 MG PO TABS: 150.0000 mg | ORAL_TABLET | Freq: Every day | ORAL | 0 refills | Status: AC

## 2023-02-03 NOTE — ED Triage Notes (Signed)
Dysuria with urgency, frequency and feeling like she cannot empty.  Pt states she also has cloudy white vaginal discharge

## 2023-02-03 NOTE — ED Provider Notes (Signed)
[place] EMERGENCY DE[facility]MENT [facility] [place] Provider Note   CSN: [id number] Arrival date & time: 02/03/23  1740     History  Chief Complaint  Patient presents with   Dysuria   Vaginal Discharge    Anita Gomez is a 37 y.o. female.  37 year old female presents today for evaluation of urinary frequency, urgency, and some dysuria.  Also reports some vaginal discharge.  She states her symptoms have been going on for about 1 week.  The vaginal discharge started after she tried some over-the-counter medications to treat her UTI.  She denies any fever, flank pain, abdominal pain, nausea, vomiting.  Does endorse unprotected sexual intercourse.  The history is provided by the patient. No language interpreter was used.       Home Medications Prior to Admission medications   Medication Sig Start Date End Date Taking? Authorizing Provider  ibuprofen (ADVIL) 600 MG tablet Take 1 tablet (600 mg total) by mouth every 6 (six) hours as needed. 10/10/21   Jeanell Sparrow, DO      Allergies    Iodine and Shellfish allergy    Review of Systems   Review of Systems  Constitutional:  Negative for chills and fever.  Gastrointestinal:  Negative for abdominal pain, nausea and vomiting.  Genitourinary:  Positive for dysuria, frequency, urgency and vaginal discharge. Negative for flank pain and vaginal bleeding.  All other systems reviewed and are negative.   Physical Exam Updated Vital Signs BP 128/82 (BP Location: Left Arm)   Pulse 67   Temp 97.8 F (36.6 C)   Resp 18   Ht 5\' 5"  (1.651 m)   Wt 76.7 kg   LMP 01/12/2023   SpO2 100%   BMI 28.12 kg/m  Physical Exam Vitals and nursing note reviewed.  Constitutional:      General: She is not in acute distress.    Appearance: Normal appearance. She is not ill-appearing.  HENT:     Head: Normocephalic and atraumatic.     Nose: Nose normal.  Eyes:     Conjunctiva/sclera: Conjunctivae normal.  Cardiovascular:     Rate  and Rhythm: Normal rate and regular rhythm.  Pulmonary:     Effort: Pulmonary effort is normal. No respiratory distress.     Breath sounds: No wheezing.  Abdominal:     General: There is no distension.     Palpations: Abdomen is soft.     Tenderness: There is no abdominal tenderness. There is no right CVA tenderness, left CVA tenderness or guarding.  Musculoskeletal:        General: No deformity.  Skin:    Findings: No rash.  Neurological:     Mental Status: She is alert.     ED Results / Procedures / Treatments   Labs (all labs ordered are listed, but only abnormal results are displayed) Labs Reviewed  WET PREP, GENITAL - Abnormal; Notable for the following components:      Result Value   Yeast Wet Prep HPF POC PRESENT (*)    WBC, Wet Prep HPF POC >=10 (*)    All other components within normal limits  URINALYSIS, ROUTINE W REFLEX MICROSCOPIC - Abnormal; Notable for the following components:   Hgb urine dipstick TRACE (*)    Protein, ur 30 (*)    Leukocytes,Ua SMALL (*)    All other components within normal limits  URINALYSIS, MICROSCOPIC (REFLEX) - Abnormal; Notable for the following components:   Bacteria, UA FEW (*)  All other components within normal limits  PREGNANCY, URINE  GC/CHLAMYDIA PROBE AMP ([place]) NOT AT [facility]    EKG None  Radiology No results found.  Procedures Procedures    Medications Ordered in ED Medications - No data to display  ED Course/ Medical Decision Making/ A&P                             Medical Decision Making Amount and/or Complexity of Data Reviewed Labs: ordered.   37 year old female presents today for evaluation of concern of UTI.  She has dysuria, frequency, urgency.  She tried over-the-counter medications without relief.  After trying the over-the-counter medication she developed vaginal discharge.  Also endorses unprotected sexual intercourse.  Is without any abdominal pain, or flank pain.  STI panel collected.  Wet  prep does show evidence of yeast.  Will start patient on fluconazole, and Keflex for UTI.  There is mild amount of leukocytes with 6-10 WBCs on UA.  Will send urine culture.  Patient is appropriate for discharge.  Discharged in stable condition.  Return precaution discussed.  She is in agreement with plan.   Final Clinical Impression(s) / ED Diagnoses Final diagnoses:  Urinary tract infection without hematuria, site unspecified  Yeast vaginitis    Rx / DC Orders ED Discharge Orders          Ordered    cephALEXin (KEFLEX) 500 MG capsule  3 times daily        02/03/23 1924    fluconazole (DIFLUCAN) 150 MG tablet  Daily        02/03/23 1924              Evlyn Courier, PA-C 02/03/23 1924    Gareth Morgan, MD 02/04/23 1427

## 2023-02-03 NOTE — ED Notes (Signed)
Unable to discharge due to registration locking the chart

## 2023-02-03 NOTE — Discharge Instructions (Signed)
Your workup today showed evidence of UTI, and yeast infection.  Have sent antibiotics and fluconazole into the pharmacy for you.  We collected a STI panel.  Should take about 2 days to result.  If these are abnormal you will get a call.

## 2023-02-03 NOTE — ED Notes (Signed)
Discharge paperwork reviewed entirely with patient, including Rx's and follow up care. Pain was under control. Pt verbalized understanding as well as all parties involved. No questions or concerns voiced at the time of discharge. No acute distress noted.   Pt ambulated out to PVA without incident or assistance.  

## 2023-02-04 LAB — GC/CHLAMYDIA PROBE AMP (~~LOC~~) NOT AT ARMC
Chlamydia: NEGATIVE
Comment: NEGATIVE
Comment: NORMAL
Neisseria Gonorrhea: NEGATIVE

## 2023-06-15 ENCOUNTER — Other Ambulatory Visit: Payer: Self-pay

## 2023-06-15 ENCOUNTER — Emergency Department (HOSPITAL_BASED_OUTPATIENT_CLINIC_OR_DEPARTMENT_OTHER)
Admission: EM | Admit: 2023-06-15 | Discharge: 2023-06-15 | Disposition: A | Discharge status: Home / Self Care | Payer: Self-pay | Attending: Emergency Medicine | Admitting: Emergency Medicine

## 2023-06-15 DIAGNOSIS — N3000 Acute cystitis without hematuria: Secondary | ICD-10-CM | POA: Insufficient documentation

## 2023-06-15 LAB — WET PREP, GENITAL
Clue Cells Wet Prep HPF POC: NONE SEEN
Sperm: NONE SEEN
Trich, Wet Prep: NONE SEEN
WBC, Wet Prep HPF POC: <10 (ref <10)
Yeast Wet Prep HPF POC: NONE SEEN

## 2023-06-15 LAB — PREGNANCY, URINE: Preg Test, Ur: NEGATIVE

## 2023-06-15 LAB — URINALYSIS, ROUTINE W REFLEX MICROSCOPIC
Bilirubin Urine: NEGATIVE
Glucose, UA: NEGATIVE mg/dL
Hgb urine dipstick: NEGATIVE
Ketones, ur: NEGATIVE mg/dL
Nitrite: NEGATIVE
Protein, ur: 30 mg/dL — AB
Specific Gravity, Urine: 1.025 (ref 1.005–1.030)
pH: 7 (ref 5.0–8.0)

## 2023-06-15 LAB — URINALYSIS, MICROSCOPIC (REFLEX)

## 2023-06-15 MED ORDER — CEPHALEXIN 500 MG PO CAPS
500.0000 mg | ORAL_CAPSULE | Freq: Two times a day (BID) | ORAL | 0 refills | Status: AC
Start: 2023-06-15 — End: 2023-06-22

## 2023-06-15 MED ORDER — FLUCONAZOLE 150 MG PO TABS: 150.0000 mg | ORAL_TABLET | Freq: Once | ORAL | 0 refills | Status: AC

## 2023-06-15 NOTE — ED Triage Notes (Signed)
Pt reports dysuria, and a "weird smell" coming from urine. Symptoms started last week.

## 2023-06-15 NOTE — ED Provider Notes (Signed)
[place] [facility] Provider Note   CSN: [id number] Arrival date & time: 06/15/23  0730     History  Chief Complaint  Patient presents with   Dysuria    Anita Gomez is a 37 y.o. female.  37 year old female with a history of urinary tract infections who presents to the emergency department with dysuria and foul-smelling urine.  Says that since Thursday of last week has had increased urinary frequency.  Also some mild dysuria.  Also noticed an odor that had been having as well.  Does have some small amount of clear vaginal discharge which she says is normal for her.  No fevers or chills.  No significant lower abdominal pain.  Treated several months ago for UTI.  Does occasionally get yeast infections after treatment.  Says she is monogamous with 1 other partner.  Unsure about STI but says that her partner is asymptomatic.       Home Medications Prior to Admission medications   Medication Sig Start Date End Date Taking? Authorizing Provider  cephALEXin (KEFLEX) 500 MG capsule Take 1 capsule (500 mg total) by mouth 2 (two) times daily for 7 days. 06/15/23 06/22/23 Yes Rondel Baton, MD  fluconazole (DIFLUCAN) 150 MG tablet Take 1 tablet (150 mg total) by mouth once for 1 dose. 06/15/23 06/15/23 Yes Rondel Baton, MD  ibuprofen (ADVIL) 600 MG tablet Take 1 tablet (600 mg total) by mouth every 6 (six) hours as needed. 10/10/21   Sloan Leiter, DO      Allergies    Iodine and Shellfish allergy    Review of Systems   Review of Systems  Physical Exam Updated Vital Signs BP (!) 149/99 (BP Location: Right Arm)   Pulse 65   Temp 98 F (36.7 C) (Oral)   Resp 18   SpO2 100%  Physical Exam Vitals and nursing note reviewed.  Constitutional:      General: She is not in acute distress.    Appearance: She is well-developed.  HENT:     Head: Normocephalic and atraumatic.     Right Ear: External ear normal.     Left Ear: External ear  normal.     Nose: Nose normal.  Eyes:     Extraocular Movements: Extraocular movements intact.     Conjunctiva/sclera: Conjunctivae normal.     Pupils: Pupils are equal, round, and reactive to light.  Cardiovascular:     Rate and Rhythm: Normal rate and regular rhythm.  Pulmonary:     Effort: Pulmonary effort is normal. No respiratory distress.  Abdominal:     General: Abdomen is flat. There is no distension.     Palpations: Abdomen is soft. There is no mass.     Tenderness: There is no abdominal tenderness. There is no guarding.  Musculoskeletal:     Cervical back: Normal range of motion and neck supple.  Skin:    General: Skin is warm and dry.  Neurological:     Mental Status: She is alert and oriented to person, place, and time. Mental status is at baseline.  Psychiatric:        Mood and Affect: Mood normal.     ED Results / Procedures / Treatments   Labs (all labs ordered are listed, but only abnormal results are displayed) Labs Reviewed  URINALYSIS, ROUTINE W REFLEX MICROSCOPIC - Abnormal; Notable for the following components:      Result Value   Protein, ur 30 (*)  Leukocytes,Ua SMALL (*)    All other components within normal limits  URINALYSIS, MICROSCOPIC (REFLEX) - Abnormal; Notable for the following components:   Bacteria, UA MANY (*)    All other components within normal limits  WET PREP, GENITAL  URINE CULTURE  PREGNANCY, URINE  GC/CHLAMYDIA PROBE AMP ([place]) NOT AT [facility]    EKG None  Radiology No results found.  Procedures Procedures    Medications Ordered in ED Medications - No data to display  ED Course/ Medical Decision Making/ A&P                             Medical Decision Making Amount and/or Complexity of Data Reviewed Labs: ordered.  Risk Prescription drug management.   Anita Gomez is a 37 y.o. female with comorbidities that complicate the patient evaluation including urinary tract infections who presents emergency  department with dysuria and foul-smelling urine  Initial Ddx:  Urinary tract infection, STI, PID, pyelonephritis  MDM/Course:  Feel the patient likely has a urinary tract infection based on her symptoms.  Urinalysis does appear to be consistent with UTI.  No signs of BV or trichomonas on her wet prep.  Low concern for STIs at this time since her symptoms are likely explained by urinary tract infection she is not high risk for STIs.  Will have her check her MyChart for the results of her gonorrhea and chlamydia and counseled her to return for treatment if positive.  No systemic symptoms that would suggest pyelonephritis.  No abdominal pain to suggest PID.  Upon re-evaluation patient remained stable.  Will send her home with Keflex and fluconazole to prevent any yeast infection which she says she has had after antibiotics in the past.  This patient presents to the ED for concern of complaints listed in HPI, this involves an extensive number of treatment options, and is a complaint that carries with it a high risk of complications and morbidity. Disposition including potential need for admission considered.   Dispo: DC Home. Return precautions discussed including, but not limited to, those listed in the AVS. Allowed pt time to ask questions which were answered fully prior to dc.  Records reviewed Outpatient Clinic Notes The following labs were independently interpreted: Urinalysis and show urinary tract infection I have reviewed the patients home medications and made adjustments as needed        Final Clinical Impression(s) / ED Diagnoses Final diagnoses:  Acute cystitis without hematuria    Rx / DC Orders ED Discharge Orders          Ordered    cephALEXin (KEFLEX) 500 MG capsule  2 times daily        06/15/23 0823    fluconazole (DIFLUCAN) 150 MG tablet   Once        06/15/23 0823              Rondel Baton, MD 06/15/23 [phone]

## 2023-06-15 NOTE — Discharge Instructions (Signed)
You were seen for your urinary tract infection in the emergency department.   Check your MyChart for the results of your pending tests.  At home, please take the antibiotics we have prescribed you.   Return immediately to the emergency department if you experience any of the following: high fevers, severe flank pain, or any other concerning symptoms.    Thank you for visiting our Emergency Department. It was a pleasure taking care of you today.

## 2023-06-18 ENCOUNTER — Telehealth (HOSPITAL_BASED_OUTPATIENT_CLINIC_OR_DEPARTMENT_OTHER): Payer: Self-pay

## 2023-06-18 NOTE — Telephone Encounter (Signed)
Post ED Visit - Positive Culture Follow-up  Culture report reviewed by antimicrobial stewardship pharmacist: [facility] [address], [place].D. []  Celedonio Miyamoto, Pharm.D., BCPS AQ-ID []  Garvin Fila, Pharm.D., BCPS []  Georgina Pillion, Pharm.D., BCPS []  [place], [address].D., BCPS, AAHIVP []  Estella Husk, Pharm.D., BCPS, AAHIVP []  Lysle Pearl, PharmD, BCPS []  Phillips Climes, PharmD, BCPS []  Agapito Games, PharmD, BCPS []  Verlan Friends, PharmD []  Mervyn Gay, PharmD, BCPS []  Vinnie Level, PharmD  [facility] []  Len Childs, PharmD []  Greer Pickerel, PharmD []  Adalberto Cole, PharmD []  Perlie Gold, Rph []  Lonell Face) Jean Rosenthal, PharmD []  Earl Many, PharmD []  Junita Push, PharmD []  Dorna Leitz, PharmD []  Terrilee Files, PharmD []  Lynann Beaver, PharmD []  Keturah Barre, PharmD []  Loralee Pacas, PharmD []  Bernadene Person, PharmD   Positive urine culture Treated with Cephalexin and Fluconazole, organism sensitive to the same and no further patient follow-up is required at this time.  Sandria Senter 06/18/2023, 9:32 AM
